# Patient Record
Sex: Male | Born: 1950 | Race: White | Hispanic: No | Marital: Married | Smoking: Former smoker
Health system: Southern US, Community
[De-identification: ages and names within clinical notes are randomized; demographics above are authoritative.]

## PROBLEM LIST (undated history)

## (undated) DIAGNOSIS — R350 Frequency of micturition: Secondary | ICD-10-CM

## (undated) DIAGNOSIS — N400 Enlarged prostate without lower urinary tract symptoms: Secondary | ICD-10-CM

## (undated) DIAGNOSIS — E291 Testicular hypofunction: Secondary | ICD-10-CM

## (undated) DIAGNOSIS — E785 Hyperlipidemia, unspecified: Secondary | ICD-10-CM

## (undated) DIAGNOSIS — I10 Essential (primary) hypertension: Secondary | ICD-10-CM

## (undated) DIAGNOSIS — Z85828 Personal history of other malignant neoplasm of skin: Secondary | ICD-10-CM

## (undated) DIAGNOSIS — G4730 Sleep apnea, unspecified: Secondary | ICD-10-CM

## (undated) DIAGNOSIS — M1990 Unspecified osteoarthritis, unspecified site: Secondary | ICD-10-CM

## (undated) DIAGNOSIS — N529 Male erectile dysfunction, unspecified: Secondary | ICD-10-CM

## (undated) DIAGNOSIS — K219 Gastro-esophageal reflux disease without esophagitis: Secondary | ICD-10-CM

## (undated) DIAGNOSIS — R011 Cardiac murmur, unspecified: Secondary | ICD-10-CM

## (undated) HISTORY — DX: Frequency of micturition: R35.0

## (undated) HISTORY — DX: Benign prostatic hyperplasia without lower urinary tract symptoms: N40.0

## (undated) HISTORY — DX: Cardiac murmur, unspecified: R01.1

## (undated) HISTORY — DX: Male erectile dysfunction, unspecified: N52.9

## (undated) HISTORY — DX: Hyperlipidemia, unspecified: E78.5

## (undated) HISTORY — PX: OTHER SURGICAL HISTORY: SHX169

## (undated) HISTORY — PX: BASAL CELL CARCINOMA EXCISION: SHX1214

## (undated) HISTORY — DX: Testicular hypofunction: E29.1

## (undated) HISTORY — DX: Essential (primary) hypertension: I10

## (undated) HISTORY — DX: Unspecified osteoarthritis, unspecified site: M19.90

## (undated) HISTORY — DX: Personal history of other malignant neoplasm of skin: Z85.828

---

## 2007-01-05 DIAGNOSIS — D239 Other benign neoplasm of skin, unspecified: Secondary | ICD-10-CM

## 2007-01-05 HISTORY — DX: Other benign neoplasm of skin, unspecified: D23.9

## 2008-07-24 DIAGNOSIS — L570 Actinic keratosis: Secondary | ICD-10-CM

## 2008-07-24 HISTORY — DX: Actinic keratosis: L57.0

## 2015-03-13 ENCOUNTER — Other Ambulatory Visit: Payer: Self-pay | Admitting: Unknown Physician Specialty

## 2015-03-23 ENCOUNTER — Encounter: Payer: Self-pay | Admitting: *Deleted

## 2015-03-24 ENCOUNTER — Ambulatory Visit: Payer: Self-pay | Admitting: Urology

## 2015-03-30 ENCOUNTER — Ambulatory Visit (INDEPENDENT_AMBULATORY_CARE_PROVIDER_SITE_OTHER): Payer: 59 | Admitting: Urology

## 2015-03-30 ENCOUNTER — Encounter: Payer: Self-pay | Admitting: Urology

## 2015-03-30 VITALS — BP 123/80 | HR 86 | Resp 18 | Ht 69.0 in | Wt 220.0 lb

## 2015-03-30 DIAGNOSIS — N138 Other obstructive and reflux uropathy: Secondary | ICD-10-CM

## 2015-03-30 DIAGNOSIS — N401 Enlarged prostate with lower urinary tract symptoms: Secondary | ICD-10-CM | POA: Diagnosis not present

## 2015-03-30 LAB — BLADDER SCAN AMB NON-IMAGING

## 2015-03-30 NOTE — Progress Notes (Signed)
03/30/2015 4:41 PM   Charle Weatherford 20-Nov-1950 [id number]  Referring provider: No referring provider defined for this encounter.  Chief Complaint  Patient presents with  . Benign Prostatic Hypertrophy  . Follow-up    Last ov 12/19/14    HPI: Today is a 64 year old white male with BPH and LUTS who is currently on Flomax, but was having urinary frequency, urgency and urge incontinence. He was found to be drinking large amounts of diet soda. It was suggested that he remove diet sodas from his diet and return in 3 months for uroflow/PVR.  He did not complete a uroflow today because he urinated prior to coming to the appointment and was found to have a low residual on today's exam. IPSS score today is 8, which is moderate symptomatology for LUTS.  His previous IPSS was 10/4 on 12/19/2014. His quality of life is 3, which is mixed due to his urinary symptoms.  His PVR today is 58 mL.  He did not completely eliminate his diet soda consumption, but he did reduce it drastically. He has seen an improvement in his urinary urgency, frequency and urge incontinence. He states he has a bad Gellis once in a week's time.  He denies any gross hematuria, dysuria or suprapubic pain. He also denies any fever, chills, nausea or vomiting.      IPSS      03/30/15 1600       International Prostate Symptom Score   How often have you had the sensation of not emptying your bladder? Less than 1 in 5     How often have you had to urinate less than every two hours? Less than 1 in 5 times     How often have you found you stopped and started again several times when you urinated? Less than 1 in 5 times     How often have you found it difficult to postpone urination? Less than 1 in 5 times     How often have you had a weak urinary stream? Less than 1 in 5 times     How often have you had to strain to start urination? Less than 1 in 5 times     How many times did you typically get up at night to urinate? 2 Times     Total IPSS Score 8     Quality of Life due to urinary symptoms   If you were to spend the rest of your life with your urinary condition just the way it is now how would you feel about that? Mixed        Score:  1-7 Mild 8-19 Moderate 20-35 Severe  PMH: Past Medical History  Diagnosis Date  . History of skin cancer   . Heart murmur   . Hypertension   . HLD (hyperlipidemia)   . Osteoarthritis   . BPH without obstruction/lower urinary tract symptoms   . Urinary frequency   . Hypogonadism in male   . Erectile dysfunction     Surgical History: Past Surgical History  Procedure Laterality Date  . Basal cell carcinoma excision      Head  . Spider bite Left arm    major reconstructive surgery for gangrene  . Circumscision      Home Medications:    Medication List       This list is accurate as of: 03/30/15  4:41 PM.  Always use your most recent med list.  amLODipine 10 MG tablet  Commonly known as:  NORVASC  Take 1 tablet by mouth  daily     aspirin EC 81 MG tablet  Take 81 mg by mouth daily.     hydrochlorothiazide 25 MG tablet  Commonly known as:  HYDRODIURIL  Take 1 tablet by mouth  daily     ketoconazole 2 % cream  Commonly known as:  NIZORAL  APPLY ON THE SKIN TWICE A [place] (AFFECTED AREA ON CHEST)     losartan 50 MG tablet  Commonly known as:  COZAAR  Take 1 tablet by mouth once a [place]     meloxicam 15 MG tablet  Commonly known as:  MOBIC  Take 15 mg by mouth daily.     ranitidine 300 MG tablet  Commonly known as:  ZANTAC     sildenafil 50 MG tablet  Commonly known as:  VIAGRA  Take 50 mg by mouth daily as needed for erectile dysfunction.     SOOLANTRA 1 % Crea  Generic drug:  Ivermectin  APPLY ON THE SKIN DAILY     tamsulosin 0.4 MG Caps capsule  Commonly known as:  FLOMAX  Take 0.4 mg by mouth daily.        Allergies: No Known Allergies  Family History: Family History  Problem Relation Age of Onset  . Hypertension  Mother     Social History:  reports that he quit smoking about 20 years ago. His smoking use included Cigarettes. He does not have any smokeless tobacco history on file. He reports that he does not drink alcohol or use illicit drugs.  ROS: Urological Symptom Review  Patient is experiencing the following symptoms: Frequent urination Hard to postpone urination Get up at night to urinate Leakage of urine Stream starts and stops Erection problems (male only)   Review of Systems  Gastrointestinal (upper)  : Indigestion/heartburn  Gastrointestinal (lower) : Negative for lower GI symptoms  Constitutional : Negative for symptoms  Skin: Negative for skin symptoms  Eyes: Negative for eye symptoms  Ear/Nose/Throat : Negative for Ear/Nose/Throat symptoms  Hematologic/Lymphatic: Negative for Hematologic/Lymphatic symptoms  Cardiovascular : Negative for cardiovascular symptoms  Respiratory : Negative for respiratory symptoms  Endocrine: Negative for endocrine symptoms  Musculoskeletal: Joint pain  Neurological: Negative for neurological symptoms  Psychologic: Negative for psychiatric symptoms  Physical Exam: BP 123/80 mmHg  Pulse 86  Resp 18  Ht 5\' 9"  (1.753 m)  Wt 220 lb (99.791 kg)  BMI 32.47 kg/m2   Laboratory Data: No results found for: WBC, HGB, HCT, MCV, PLT  No results found for: CREATININE  No results found for: PSA  No results found for: TESTOSTERONE  No results found for: HGBA1C  Urinalysis No results found for: COLORURINE, APPEARANCEUR, LABSPEC, PHURINE, GLUCOSEU, HGBUR, BILIRUBINUR, KETONESUR, PROTEINUR, UROBILINOGEN, NITRITE, LEUKOCYTESUR  Pertinent Imaging:   Assessment & Plan:   1. BPH (benign prostatic hyperplasia) with LUTS:   His IPSS today is 8/3.   His PVR is 58 mL.Patient is still not completely satisfied with his urinary symptoms.  He would like to try  a different medication. I explained him some people have had a better  response to Rapaflo, but it is a brand-name medication.  I have given him samples of Rapaflo. He is to contact the office if he finds improvement with the Rapaflo, so that we may call him in a prescription. If he does not find significant improvement with the Rapaflo, he will continue the Flomax and return in February  for IPSS, PVR, DRE and PSA.  - BLADDER SCAN AMB NON-IMAGING   No Follow-up on file.  Zara Council, [facility] [address], [place], [place] [address] [phone]

## 2015-04-09 ENCOUNTER — Encounter: Payer: Self-pay | Admitting: Podiatry

## 2015-04-09 ENCOUNTER — Ambulatory Visit (INDEPENDENT_AMBULATORY_CARE_PROVIDER_SITE_OTHER): Payer: 59 | Admitting: Podiatry

## 2015-04-09 ENCOUNTER — Ambulatory Visit (INDEPENDENT_AMBULATORY_CARE_PROVIDER_SITE_OTHER): Payer: 59

## 2015-04-09 VITALS — BP 131/76 | HR 92 | Resp 18

## 2015-04-09 DIAGNOSIS — M25572 Pain in left ankle and joints of left foot: Secondary | ICD-10-CM | POA: Diagnosis not present

## 2015-04-09 DIAGNOSIS — M7662 Achilles tendinitis, left leg: Secondary | ICD-10-CM

## 2015-04-09 DIAGNOSIS — M7732 Calcaneal spur, left foot: Secondary | ICD-10-CM

## 2015-04-09 DIAGNOSIS — Q828 Other specified congenital malformations of skin: Secondary | ICD-10-CM

## 2015-04-09 NOTE — Patient Instructions (Signed)
Plantar Fasciitis (Heel Spur Syndrome) with Rehab The plantar fascia is a fibrous, ligament-like, soft-tissue structure that spans the bottom of the foot. Plantar fasciitis is a condition that causes pain in the foot due to inflammation of the tissue. SYMPTOMS   Pain and tenderness on the underneath side of the foot.  Pain that worsens with standing or walking. CAUSES  Plantar fasciitis is caused by irritation and injury to the plantar fascia on the underneath side of the foot. Common mechanisms of injury include:  Direct trauma to bottom of the foot.  Damage to a small nerve that runs under the foot where the main fascia attaches to the heel bone.  Stress placed on the plantar fascia due to bone spurs. RISK INCREASES WITH:   Activities that place stress on the plantar fascia (running, jumping, pivoting, or cutting).  Poor strength and flexibility.  Improperly fitted shoes.  Tight calf muscles.  Flat feet.  Failure to warm-up properly before activity.  Obesity. PREVENTION  Warm up and stretch properly before activity.  Allow for adequate recovery between workouts.  Maintain physical fitness:  Strength, flexibility, and endurance.  Cardiovascular fitness.  Maintain a health body weight.  Avoid stress on the plantar fascia.  Wear properly fitted shoes, including arch supports for individuals who have flat feet. PROGNOSIS  If treated properly, then the symptoms of plantar fasciitis usually resolve without surgery. However, occasionally surgery is necessary. RELATED COMPLICATIONS   Recurrent symptoms that may result in a chronic condition.  Problems of the lower back that are caused by compensating for the injury, such as limping.  Pain or weakness of the foot during push-off following surgery.  Chronic inflammation, scarring, and partial or complete fascia tear, occurring more often from repeated injections. TREATMENT  Treatment initially involves the use of  ice and medication to help reduce pain and inflammation. The use of strengthening and stretching exercises may help reduce pain with activity, especially stretches of the Achilles tendon. These exercises may be performed at home or with a therapist. Your caregiver may recommend that you use heel cups of arch supports to help reduce stress on the plantar fascia. Occasionally, corticosteroid injections are given to reduce inflammation. If symptoms persist for greater than 6 months despite non-surgical (conservative), then surgery may be recommended.  MEDICATION   If pain medication is necessary, then nonsteroidal anti-inflammatory medications, such as aspirin and ibuprofen, or other minor pain relievers, such as acetaminophen, are often recommended.  Do not take pain medication within 7 days before surgery.  Prescription pain relievers may be given if deemed necessary by your caregiver. Use only as directed and only as much as you need.  Corticosteroid injections may be given by your caregiver. These injections should be reserved for the most serious cases, because they may only be given a certain number of times. HEAT AND COLD  Cold treatment (icing) relieves pain and reduces inflammation. Cold treatment should be applied for 10 to 15 minutes every 2 to 3 hours for inflammation and pain and immediately after any activity that aggravates your symptoms. Use ice packs or massage the area with a piece of ice (ice massage).  Heat treatment may be used prior to performing the stretching and strengthening activities prescribed by your caregiver, physical therapist, or athletic trainer. Use a heat pack or soak the injury in warm water. SEEK IMMEDIATE MEDICAL CARE IF:  Treatment seems to offer no benefit, or the condition worsens.  Any medications produce adverse side effects. EXERCISES RANGE   OF MOTION (ROM) AND STRETCHING EXERCISES - Plantar Fasciitis (Heel Spur Syndrome) These exercises may help you  when beginning to rehabilitate your injury. Your symptoms may resolve with or without further involvement from your physician, physical therapist or athletic trainer. While completing these exercises, remember:   Restoring tissue flexibility helps normal motion to return to the joints. This allows healthier, less painful movement and activity.  An effective stretch should be held for at least 30 seconds.  A stretch should never be painful. You should only feel a gentle lengthening or release in the stretched tissue. RANGE OF MOTION - Toe Extension, Flexion  Sit with your right / left leg crossed over your opposite knee.  Grasp your toes and gently pull them back toward the top of your foot. You should feel a stretch on the bottom of your toes and/or foot.  Hold this stretch for __________ seconds.  Now, gently pull your toes toward the bottom of your foot. You should feel a stretch on the top of your toes and or foot.  Hold this stretch for __________ seconds. Repeat __________ times. Complete this stretch __________ times per Taffe.  RANGE OF MOTION - Ankle Dorsiflexion, Active Assisted  Remove shoes and sit on a chair that is preferably not on a carpeted surface.  Place right / left foot under knee. Extend your opposite leg for support.  Keeping your heel down, slide your right / left foot back toward the chair until you feel a stretch at your ankle or calf. If you do not feel a stretch, slide your bottom forward to the edge of the chair, while still keeping your heel down.  Hold this stretch for __________ seconds. Repeat __________ times. Complete this stretch __________ times per Gorney.  STRETCH - Gastroc, Standing  Place hands on wall.  Extend right / left leg, keeping the front knee somewhat bent.  Slightly point your toes inward on your back foot.  Keeping your right / left heel on the floor and your knee straight, shift your weight toward the wall, not allowing your back to  arch.  You should feel a gentle stretch in the right / left calf. Hold this position for __________ seconds. Repeat __________ times. Complete this stretch __________ times per Zielinski. STRETCH - Soleus, Standing  Place hands on wall.  Extend right / left leg, keeping the other knee somewhat bent.  Slightly point your toes inward on your back foot.  Keep your right / left heel on the floor, bend your back knee, and slightly shift your weight over the back leg so that you feel a gentle stretch deep in your back calf.  Hold this position for __________ seconds. Repeat __________ times. Complete this stretch __________ times per Rohrig. STRETCH - Gastrocsoleus, Standing  Note: This exercise can place a lot of stress on your foot and ankle. Please complete this exercise only if specifically instructed by your caregiver.   Place the ball of your right / left foot on a step, keeping your other foot firmly on the same step.  Hold on to the wall or a rail for balance.  Slowly lift your other foot, allowing your body weight to press your heel down over the edge of the step.  You should feel a stretch in your right / left calf.  Hold this position for __________ seconds.  Repeat this exercise with a slight bend in your right / left knee. Repeat __________ times. Complete this stretch __________ times per Dilley.    STRENGTHENING EXERCISES - Plantar Fasciitis (Heel Spur Syndrome)  These exercises may help you when beginning to rehabilitate your injury. They may resolve your symptoms with or without further involvement from your physician, physical therapist or athletic trainer. While completing these exercises, remember:   Muscles can gain both the endurance and the strength needed for everyday activities through controlled exercises.  Complete these exercises as instructed by your physician, physical therapist or athletic trainer. Progress the resistance and repetitions only as guided. STRENGTH -  Towel Curls  Sit in a chair positioned on a non-carpeted surface.  Place your foot on a towel, keeping your heel on the floor.  Pull the towel toward your heel by only curling your toes. Keep your heel on the floor.  If instructed by your physician, physical therapist or athletic trainer, add ____________________ at the end of the towel. Repeat __________ times. Complete this exercise __________ times per Hosman. STRENGTH - Ankle Inversion  Secure one end of a rubber exercise band/tubing to a fixed object (table, pole). Loop the other end around your foot just before your toes.  Place your fists between your knees. This will focus your strengthening at your ankle.  Slowly, pull your big toe up and in, making sure the band/tubing is positioned to resist the entire motion.  Hold this position for __________ seconds.  Have your muscles resist the band/tubing as it slowly pulls your foot back to the starting position. Repeat __________ times. Complete this exercises __________ times per Manganello.  Document Released: 08/29/2005 Document Revised: 11/21/2011 Document Reviewed: 12/11/2008 [facility] Patient Information 2015 [place], [place]. This information is not intended to replace advice given to you by your health care provider. Make sure you discuss any questions you have with your health care provider. Achilles Tendinitis  with Rehab Achilles tendinitis is a disorder of the Achilles tendon. The Achilles tendon connects the large calf muscles (Gastrocnemius and Soleus) to the heel bone (calcaneus). This tendon is sometimes called the heel cord. It is important for pushing-off and standing on your toes and is important for walking, running, or jumping. Tendinitis is often caused by overuse and repetitive microtrauma. SYMPTOMS  Pain, tenderness, swelling, warmth, and redness may occur over the Achilles tendon even at rest.  Pain with pushing off, or flexing or extending the ankle.  Pain that is  worsened after or during activity. CAUSES   Overuse sometimes seen with rapid increase in exercise programs or in sports requiring running and jumping.  Poor physical conditioning (strength and flexibility or endurance).  Running sports, especially training running down hills.  Inadequate warm-up before practice or play or failure to stretch before participation.  Injury to the tendon. PREVENTION   Warm up and stretch before practice or competition.  Allow time for adequate rest and recovery between practices and competition.  Keep up conditioning.  Keep up ankle and leg flexibility.  Improve or keep muscle strength and endurance.  Improve cardiovascular fitness.  Use proper technique.  Use proper equipment (shoes, skates).  To help prevent recurrence, taping, protective strapping, or an adhesive bandage may be recommended for several weeks after healing is complete. PROGNOSIS   Recovery may take weeks to several months to heal.  Longer recovery is expected if symptoms have been prolonged.  Recovery is usually quicker if the inflammation is due to a direct blow as compared with overuse or sudden strain. RELATED COMPLICATIONS   Healing time will be prolonged if the condition is not correctly treated. The injury must  be given plenty of time to heal.  Symptoms can reoccur if activity is resumed too soon.  Untreated, tendinitis may increase the risk of tendon rupture requiring additional time for recovery and possibly surgery. TREATMENT   The first treatment consists of rest anti-inflammatory medication, and ice to relieve the pain.  Stretching and strengthening exercises after resolution of pain will likely help reduce the risk of recurrence. Referral to a physical therapist or athletic trainer for further evaluation and treatment may be helpful.  A walking boot or cast may be recommended to rest the Achilles tendon. This can help break the cycle of inflammation and  microtrauma.  Arch supports (orthotics) may be prescribed or recommended by your caregiver as an adjunct to therapy and rest.  Surgery to remove the inflamed tendon lining or degenerated tendon tissue is rarely necessary and has shown less than predictable results. MEDICATION   Nonsteroidal anti-inflammatory medications, such as aspirin and ibuprofen, may be used for pain and inflammation relief. Do not take within 7 days before surgery. Take these as directed by your caregiver. Contact your caregiver immediately if any bleeding, stomach upset, or signs of allergic reaction occur. Other minor pain relievers, such as acetaminophen, may also be used.  Pain relievers may be prescribed as necessary by your caregiver. Do not take prescription pain medication for longer than 4 to 7 days. Use only as directed and only as much as you need.  Cortisone injections are rarely indicated. Cortisone injections may weaken tendons and predispose to rupture. It is better to give the condition more time to heal than to use them. HEAT AND COLD  Cold is used to relieve pain and reduce inflammation for acute and chronic Achilles tendinitis. Cold should be applied for 10 to 15 minutes every 2 to 3 hours for inflammation and pain and immediately after any activity that aggravates your symptoms. Use ice packs or an ice massage.  Heat may be used before performing stretching and strengthening activities prescribed by your caregiver. Use a heat pack or a warm soak. SEEK MEDICAL CARE IF:  Symptoms get worse or do not improve in 2 weeks despite treatment.  New, unexplained symptoms develop. Drugs used in treatment may produce side effects. EXERCISES RANGE OF MOTION (ROM) AND STRETCHING EXERCISES - Achilles Tendinitis  These exercises may help you when beginning to rehabilitate your injury. Your symptoms may resolve with or without further involvement from your physician, physical therapist or athletic trainer. While  completing these exercises, remember:   Restoring tissue flexibility helps normal motion to return to the joints. This allows healthier, less painful movement and activity.  An effective stretch should be held for at least 30 seconds.  A stretch should never be painful. You should only feel a gentle lengthening or release in the stretched tissue. STRETCH - Gastroc, Standing   Place hands on wall.  Extend right / left leg, keeping the front knee somewhat bent.  Slightly point your toes inward on your back foot.  Keeping your right / left heel on the floor and your knee straight, shift your weight toward the wall, not allowing your back to arch.  You should feel a gentle stretch in the right / left calf. Hold this position for __________ seconds. Repeat __________ times. Complete this stretch __________ times per Galgano. STRETCH - Soleus, Standing   Place hands on wall.  Extend right / left leg, keeping the other knee somewhat bent.  Slightly point your toes inward on your back foot.  Keep your right / left heel on the floor, bend your back knee, and slightly shift your weight over the back leg so that you feel a gentle stretch deep in your back calf.  Hold this position for __________ seconds. Repeat __________ times. Complete this stretch __________ times per Parisi. STRETCH - Gastrocsoleus, Standing  Note: This exercise can place a lot of stress on your foot and ankle. Please complete this exercise only if specifically instructed by your caregiver.   Place the ball of your right / left foot on a step, keeping your other foot firmly on the same step.  Hold on to the wall or a rail for balance.  Slowly lift your other foot, allowing your body weight to press your heel down over the edge of the step.  You should feel a stretch in your right / left calf.  Hold this position for __________ seconds.  Repeat this exercise with a slight bend in your knee. Repeat __________ times.  Complete this stretch __________ times per Goetze.  STRENGTHENING EXERCISES - Achilles Tendinitis These exercises may help you when beginning to rehabilitate your injury. They may resolve your symptoms with or without further involvement from your physician, physical therapist or athletic trainer. While completing these exercises, remember:   Muscles can gain both the endurance and the strength needed for everyday activities through controlled exercises.  Complete these exercises as instructed by your physician, physical therapist or athletic trainer. Progress the resistance and repetitions only as guided.  You may experience muscle soreness or fatigue, but the pain or discomfort you are trying to eliminate should never worsen during these exercises. If this pain does worsen, stop and make certain you are following the directions exactly. If the pain is still present after adjustments, discontinue the exercise until you can discuss the trouble with your clinician. STRENGTH - Plantar-flexors   Sit with your right / left leg extended. Holding onto both ends of a rubber exercise band/tubing, loop it around the ball of your foot. Keep a slight tension in the band.  Slowly push your toes away from you, pointing them downward.  Hold this position for __________ seconds. Return slowly, controlling the tension in the band/tubing. Repeat __________ times. Complete this exercise __________ times per Kempker.  STRENGTH - Plantar-flexors   Stand with your feet shoulder width apart. Steady yourself with a wall or table using as little support as needed.  Keeping your weight evenly spread over the width of your feet, rise up on your toes.*  Hold this position for __________ seconds. Repeat __________ times. Complete this exercise __________ times per Lefeber.  *If this is too easy, shift your weight toward your right / left leg until you feel challenged. Ultimately, you may be asked to do this exercise with your  right / left foot only. STRENGTH - Plantar-flexors, Eccentric  Note: This exercise can place a lot of stress on your foot and ankle. Please complete this exercise only if specifically instructed by your caregiver.   Place the balls of your feet on a step. With your hands, use only enough support from a wall or rail to keep your balance.  Keep your knees straight and rise up on your toes.  Slowly shift your weight entirely to your right / left toes and pick up your opposite foot. Gently and with controlled movement, lower your weight through your right / left foot so that your heel drops below the level of the step. You will feel a  slight stretch in the back of your calf at the end position.  Use the healthy leg to help rise up onto the balls of both feet, then lower weight only on the right / left leg again. Build up to 15 repetitions. Then progress to 3 consecutive sets of 15 repetitions.*  After completing the above exercise, complete the same exercise with a slight knee bend (about 30 degrees). Again, build up to 15 repetitions. Then progress to 3 consecutive sets of 15 repetitions.* Perform this exercise __________ times per Matos.  *When you easily complete 3 sets of 15, your physician, physical therapist or athletic trainer may advise you to add resistance by wearing a backpack filled with additional weight. STRENGTH - Plantar Flexors, Seated   Sit on a chair that allows your feet to rest flat on the ground. If necessary, sit at the edge of the chair.  Keeping your toes firmly on the ground, lift your right / left heel as far as you can without increasing any discomfort in your ankle. Repeat __________ times. Complete this exercise __________ times a Pestka. *If instructed by your physician, physical therapist or athletic trainer, you may add ____________________ of resistance by placing a weighted object on your right / left knee. Document Released: 03/30/2005 Document Revised: 11/21/2011  Document Reviewed: 12/11/2008 [facility] Patient Information 2015 [place], [place]. This information is not intended to replace advice given to you by your health care provider. Make sure you discuss any questions you have with your health care provider.

## 2015-04-09 NOTE — Progress Notes (Signed)
   Subjective:    Patient ID: Richard Dean, male    DOB: [birth year], 64 y.o.   MRN: [id number]  HPI  64 year old male presents the office they with concerns of pain to the back of his left ankle on the course the Achilles tendon his been ongoing for approximately 3-4 months. He doesn't that he purchase new shoes recently which seem to help alleviate his symptoms although he does continue to get some mild discomfort in the back of the area. He has pain when he first gets up in the morning and as he walks, eases up. Denies any swelling or redness. Denies numbness or tingling. No history of injury or trauma. He has says is a painful callus in the right foot. Most of his pain is areas of pressure and weightbearing. No other complaints at this time.   Review of Systems  Musculoskeletal:       Joint pain  All other systems reviewed and are negative.      Objective:   Physical Exam  AAO x3, NAD DP/PT pulses palpable bilaterally, CRT less than 3 seconds Protective sensation intact with Simms Weinstein monofilament, vibratory sensation intact, Achilles tendon reflex intact  there is mild discomfort along the posterior aspect of the left calcaneus and along the insertion of the Achilles tendon. Heel spurs palpable. There is no overlying edema, erythema, increase in warmth. There is no tenderness on the mid substance of the Achilles tendon no defect is noted. Thompson test is negative. There is no discomfort on the plantar aspect of the calcaneus on the course last insertion of the plantar fascia. No pain with lateral compression of the calcaneus. No other areas of tenderness to bilateral lower extremities. MMT 5/5, ROM WNL.   right foot submetatarsal 2 hyperkeratotic lesion upon debridement no on Ulceration, drainage or other signs of infection. No open lesions or pre-ulcerative lesions.  No overlying edema, erythema, increase in warmth to bilateral lower extremities.  No pain with calf compression,  swelling, warmth, erythema bilaterally.      Assessment & Plan:   64 year old male left Achilles tendinitis , right submetatarsal 2  Porokeratosis -X-rays were obtained and reviewed with the patient.  -Treatment options discussed including all alternatives, risks, and complications -Discussed likely etiology of his symptoms. -For the left Achilles tendinitis/retrocalcaneal exostosis discussed stretching exercises to perform a daily basis. Ice to the area. Prescribed mobic. Discussed side effects of the medication and directed to stop if any are to occur and call the office. Continue shoegear changes. Discussed orthotics -Right submetatarsal 2 hyperkeratotic lesion sharply debrided without complications by splitting. -Follow-up 4 weeks or sooner if any problems arise. In the meantime, encouraged to call the office with any questions, concerns, change in symptoms.   Celesta Gentile, DPM

## 2015-04-10 ENCOUNTER — Encounter: Payer: Self-pay | Admitting: Podiatry

## 2015-05-01 ENCOUNTER — Other Ambulatory Visit: Payer: Self-pay | Admitting: Unknown Physician Specialty

## 2015-05-05 ENCOUNTER — Ambulatory Visit (INDEPENDENT_AMBULATORY_CARE_PROVIDER_SITE_OTHER): Payer: 59 | Admitting: Unknown Physician Specialty

## 2015-05-05 ENCOUNTER — Encounter: Payer: Self-pay | Admitting: Unknown Physician Specialty

## 2015-05-05 VITALS — BP 143/90 | HR 86 | Temp 98.2°F | Ht 67.7 in | Wt 222.0 lb

## 2015-05-05 DIAGNOSIS — R059 Cough, unspecified: Secondary | ICD-10-CM

## 2015-05-05 DIAGNOSIS — E785 Hyperlipidemia, unspecified: Secondary | ICD-10-CM

## 2015-05-05 DIAGNOSIS — I10 Essential (primary) hypertension: Secondary | ICD-10-CM | POA: Diagnosis not present

## 2015-05-05 DIAGNOSIS — R05 Cough: Secondary | ICD-10-CM | POA: Diagnosis not present

## 2015-05-05 LAB — LIPID PANEL PICCOLO, WAIVED
Chol/HDL Ratio Piccolo,Waive: 4.1 mg/dL
Cholesterol Piccolo, Waived: 155 mg/dL (ref <200)
HDL Chol Piccolo, Waived: 38 mg/dL — ABNORMAL LOW (ref >59)
LDL Chol Calc Piccolo Waived: 70 mg/dL (ref <100)
Triglycerides Piccolo,Waived: 236 mg/dL — ABNORMAL HIGH (ref <150)
VLDL Chol Calc Piccolo,Waive: 47 mg/dL — ABNORMAL HIGH (ref <30)

## 2015-05-05 MED ORDER — MELOXICAM 15 MG PO TABS: 15.0000 mg | ORAL_TABLET | Freq: Every day | ORAL | Status: DC

## 2015-05-05 MED ORDER — SILDENAFIL CITRATE 50 MG PO TABS: 50.0000 mg | ORAL_TABLET | Freq: Every day | ORAL | Status: DC | PRN

## 2015-05-05 MED ORDER — AMLODIPINE BESYLATE 10 MG PO TABS: 10.0000 mg | ORAL_TABLET | Freq: Every day | ORAL | Status: DC

## 2015-05-05 MED ORDER — LOSARTAN POTASSIUM 50 MG PO TABS: 50.0000 mg | ORAL_TABLET | Freq: Every day | ORAL | Status: DC

## 2015-05-05 MED ORDER — TAMSULOSIN HCL 0.4 MG PO CAPS: 0.4000 mg | ORAL_CAPSULE | Freq: Every day | ORAL | Status: DC

## 2015-05-05 MED ORDER — RANITIDINE HCL 300 MG PO TABS: 300.0000 mg | ORAL_TABLET | Freq: Every day | ORAL | Status: DC

## 2015-05-05 MED ORDER — HYDROCHLOROTHIAZIDE 25 MG PO TABS: 25.0000 mg | ORAL_TABLET | Freq: Every day | ORAL | Status: DC

## 2015-05-05 NOTE — Assessment & Plan Note (Addendum)
Blood Pressure elevated today. Will attempt to decrease use of Meloxicam. Will recheck in 6 months.

## 2015-05-05 NOTE — Assessment & Plan Note (Addendum)
Reviewed cholesterol labs. Non-fasting lab draw. Will recheck in 6 months.

## 2015-05-05 NOTE — Progress Notes (Signed)
BP 143/90 mmHg  Pulse 86  Temp(Src) 98.2 F (36.8 C)  Ht 5' 7.7" (1.72 m)  Wt 222 lb (100.699 kg)  BMI 34.04 kg/m2  SpO2 97%   Subjective:    Patient ID: Richard Dean, male    DOB: [birth year], 64 y.o.   MRN: [id number]  HPI: [name] is a 64 y.o. male  Chief Complaint  Patient presents with  . Medication Refill  . Cough    pt states he has been coughing for about 3 to 4 weeks, also has some congestion   Hypertension/Hyperlipidemia: Denies headache, chest pain or shortness of breath. Does not monitor blood pressure at home but states he has been to several doctor visits recently and his blood pressure has always been good. Takes medications as directed.    Cough: Complaining of a dry cough and congestions that started about 3 weeks ago. Has not tried anything for the cough. Denies sinus pressure or headache. Denies shortness of breath.  It was suggested to him that possibly Losartan is causing the cough.    Relevant past medical, surgical, family and social history reviewed and updated as indicated. Interim medical history since our last visit reviewed. Allergies and medications reviewed and updated.  Review of Systems  Constitutional: Negative.  Negative for activity change, appetite change and fatigue.  HENT: Positive for congestion.   Eyes: Negative.   Respiratory: Positive for cough. Negative for chest tightness, shortness of breath, wheezing and stridor.   Cardiovascular: Negative.  Negative for chest pain, palpitations and leg swelling.  Skin: Negative.  Negative for color change, pallor, rash and wound.  Psychiatric/Behavioral: Negative.  Negative for confusion and agitation. The patient is not nervous/anxious.     Per HPI unless specifically indicated above     Objective:    BP 143/90 mmHg  Pulse 86  Temp(Src) 98.2 F (36.8 C)  Ht 5' 7.7" (1.72 m)  Wt 222 lb (100.699 kg)  BMI 34.04 kg/m2  SpO2 97%  Wt Readings from Last 3 Encounters:  05/05/15 222 lb  (100.699 kg)  03/30/15 220 lb (99.791 kg)    Physical Exam  Constitutional: He is oriented to person, place, and time. He appears well-developed and well-nourished. No distress.  HENT:  Head: Normocephalic and atraumatic.  Neck: Normal range of motion.  Cardiovascular: Normal rate, regular rhythm and normal heart sounds.  Exam reveals no gallop and no friction rub.   No murmur heard. Pulmonary/Chest: Effort normal and breath sounds normal. No respiratory distress. He has no wheezes. He has no rales. He exhibits no tenderness.  Neurological: He is alert and oriented to person, place, and time.  Skin: Skin is warm and dry. No rash noted. He is not diaphoretic. No erythema. No pallor.  Psychiatric: He has a normal mood and affect. His behavior is normal. Judgment and thought content normal.        Assessment & Plan:   Problem List Items Addressed This Visit      Unprioritized   Essential hypertension - Primary    Blood Pressure elevated today. Will attempt to decrease use of Meloxicam. Will recheck in 6 months.      Relevant Medications   amLODipine (NORVASC) 10 MG tablet   hydrochlorothiazide (HYDRODIURIL) 25 MG tablet   losartan (COZAAR) 50 MG tablet   sildenafil (VIAGRA) 50 MG tablet   Other Relevant Orders   Comprehensive metabolic panel   Hyperlipidemia    Non-fasting lab draw. Will recheck in 6 months.  Relevant Medications   amLODipine (NORVASC) 10 MG tablet   hydrochlorothiazide (HYDRODIURIL) 25 MG tablet   losartan (COZAAR) 50 MG tablet   sildenafil (VIAGRA) 50 MG tablet   Other Relevant Orders   Lipid Panel Piccolo, Waived    Other Visit Diagnoses    Cough        Stop Losartan for one month. If no improvement then start over the counter Claritin        Follow up plan: Follow up in 6 months (about 11/05/15) for physical.

## 2015-05-06 ENCOUNTER — Encounter: Payer: Self-pay | Admitting: Family Medicine

## 2015-05-06 LAB — COMPREHENSIVE METABOLIC PANEL
ALT: 21 IU/L (ref 0–44)
AST: 21 IU/L (ref 0–40)
Albumin/Globulin Ratio: 2 (ref 1.1–2.5)
Albumin: 4.4 g/dL (ref 3.6–4.8)
Alkaline Phosphatase: 66 IU/L (ref 39–117)
BUN/Creatinine Ratio: 15 (ref 10–22)
BUN: 15 mg/dL (ref 8–27)
Bilirubin Total: 0.7 mg/dL (ref 0.0–1.2)
CO2: 27 mmol/L (ref 18–29)
Calcium: 9 mg/dL (ref 8.6–10.2)
Chloride: 99 mmol/L (ref 97–108)
Creatinine, Ser: 1.02 mg/dL (ref 0.76–1.27)
GFR calc Af Amer: 89 mL/min/{1.73_m2} (ref >59)
GFR calc non Af Amer: 77 mL/min/{1.73_m2} (ref >59)
Globulin, Total: 2.2 g/dL (ref 1.5–4.5)
Glucose: 82 mg/dL (ref 65–99)
Potassium: 4.3 mmol/L (ref 3.5–5.2)
Sodium: 140 mmol/L (ref 134–144)
Total Protein: 6.6 g/dL (ref 6.0–8.5)

## 2015-05-07 ENCOUNTER — Ambulatory Visit (INDEPENDENT_AMBULATORY_CARE_PROVIDER_SITE_OTHER): Payer: 59 | Admitting: Podiatry

## 2015-05-07 ENCOUNTER — Encounter: Payer: Self-pay | Admitting: Podiatry

## 2015-05-07 DIAGNOSIS — M7662 Achilles tendinitis, left leg: Secondary | ICD-10-CM

## 2015-05-07 MED ORDER — METHYLPREDNISOLONE 4 MG PO TBPK: ORAL_TABLET | ORAL | Status: DC

## 2015-05-07 NOTE — Progress Notes (Signed)
Patient ID: Richard Dean, male   DOB: Mar 12, 1951, 64 y.o.   MRN: [id number]  Subjective: 64 year old male presents the office they for follow-up evaluation of left heel pain, Achilles tendinitis. He states that overall he is doing better but he definitely get some discomfort with activity or standing.She does continue stretching, night splint, icing, anti-inflammatories. He denies any recent injury or trauma. No swelling or redness. There is no tendon or numbness. No other complaints at this time in no acute changes since last appointment.  Objective: AAO 3, NAD DP/PT pulses palpable, CRT less than 3 seconds Protective sensation intact with Simms Weinstein monofilament There is mild discomfort on the posterior aspect of the left Achilles tendon along the mid substance of the tendon. There is no pain along the insertion of the calcaneus. There is no defect noted and Thompson test is negative. There is no other areas of tenderness to bilateral lower extremities. No overlying edema, erythema, increase in warmth. No open lesions or pre-ulcerative lesions. There is no pain with calf compression, swelling, warmth, erythema.  Assessment: 64 year old male with continuation left Achilles tendinitis  Plan: -Treatment options discussed including all alternatives, risks, and complications -Discussed with him Medrol Dosepak for which she would like to try. I discussed the risks and side effects and for which she understands. I prescribed this today. Hold off on anti-inflammatories while taking steroid. He understood. -Continue stretching, icing, night splint. -Continue to wear supportive shoe at all times. -Follow-up in 4 weeks if symptoms continue or sooner if any problems arise. In the meantime, encouraged to call the office with any questions, concerns, change in symptoms.   Annice Needy, DPM

## 2015-06-04 ENCOUNTER — Ambulatory Visit: Payer: 59 | Admitting: Podiatry

## 2015-09-04 ENCOUNTER — Other Ambulatory Visit: Payer: Self-pay | Admitting: Unknown Physician Specialty

## 2015-10-16 ENCOUNTER — Ambulatory Visit (INDEPENDENT_AMBULATORY_CARE_PROVIDER_SITE_OTHER): Payer: 59

## 2015-10-16 DIAGNOSIS — Z23 Encounter for immunization: Secondary | ICD-10-CM

## 2015-10-26 ENCOUNTER — Other Ambulatory Visit: Payer: Self-pay | Admitting: Unknown Physician Specialty

## 2015-10-28 ENCOUNTER — Other Ambulatory Visit: Payer: Self-pay | Admitting: Unknown Physician Specialty

## 2015-11-02 ENCOUNTER — Ambulatory Visit: Payer: 59 | Admitting: Urology

## 2015-11-02 ENCOUNTER — Encounter: Payer: Self-pay | Admitting: Urology

## 2015-11-10 ENCOUNTER — Encounter: Payer: Self-pay | Admitting: Unknown Physician Specialty

## 2015-11-10 ENCOUNTER — Ambulatory Visit (INDEPENDENT_AMBULATORY_CARE_PROVIDER_SITE_OTHER): Payer: 59 | Admitting: Unknown Physician Specialty

## 2015-11-10 VITALS — BP 143/84 | HR 79 | Temp 98.5°F | Ht 66.3 in | Wt 218.8 lb

## 2015-11-10 DIAGNOSIS — Z23 Encounter for immunization: Secondary | ICD-10-CM | POA: Diagnosis not present

## 2015-11-10 DIAGNOSIS — M150 Primary generalized (osteo)arthritis: Secondary | ICD-10-CM

## 2015-11-10 DIAGNOSIS — M159 Polyosteoarthritis, unspecified: Secondary | ICD-10-CM

## 2015-11-10 DIAGNOSIS — I10 Essential (primary) hypertension: Secondary | ICD-10-CM

## 2015-11-10 DIAGNOSIS — M1990 Unspecified osteoarthritis, unspecified site: Secondary | ICD-10-CM | POA: Insufficient documentation

## 2015-11-10 DIAGNOSIS — Z0000 Encounter for general adult medical examination without abnormal findings: Secondary | ICD-10-CM

## 2015-11-10 DIAGNOSIS — M8949 Other hypertrophic osteoarthropathy, multiple sites: Secondary | ICD-10-CM

## 2015-11-10 MED ORDER — LOSARTAN POTASSIUM 50 MG PO TABS: 50.0000 mg | ORAL_TABLET | Freq: Every day | ORAL | Status: DC

## 2015-11-10 MED ORDER — HYDROCHLOROTHIAZIDE 25 MG PO TABS: 25.0000 mg | ORAL_TABLET | Freq: Every day | ORAL | Status: DC

## 2015-11-10 MED ORDER — MELOXICAM 15 MG PO TABS: 15.0000 mg | ORAL_TABLET | Freq: Every day | ORAL | Status: DC

## 2015-11-10 MED ORDER — RANITIDINE HCL 300 MG PO TABS: 300.0000 mg | ORAL_TABLET | Freq: Every day | ORAL | Status: DC

## 2015-11-10 MED ORDER — AMLODIPINE BESYLATE 10 MG PO TABS: 10.0000 mg | ORAL_TABLET | Freq: Every day | ORAL | Status: DC

## 2015-11-10 NOTE — Progress Notes (Signed)
-  BP 143/84 mmHg  Pulse 79  Temp(Src) 98.5 F (36.9 C)  Ht 5' 6.3" (1.684 m)  Wt 218 lb 12.8 oz (99.247 kg)  BMI 35.00 kg/m2  SpO2 97%   Subjective:    Patient ID: Richard Dean, male    DOB: 1950/11/09, 65 y.o.   MRN: [id number]  HPI: Richard Dean is a 65 y.o. male  Chief Complaint  Patient presents with  . Annual Exam   Hypertension Using medications without difficulty Average home BPs not checking   No problems or lightheadedness No chest pain with exertion or shortness of breath No Edema  Joint pain Pt taking Meloxicam for hand and knee pain.  He has tried to go off in the past but felt like he needed to go back on.  Taking Zantac for stomach protection  ED Taking Viagra prn  Past Medical History  Diagnosis Date  . History of skin cancer   . Heart murmur   . Hypertension   . HLD (hyperlipidemia)   . Osteoarthritis   . BPH without obstruction/lower urinary tract symptoms   . Urinary frequency   . Hypogonadism in male   . Erectile dysfunction    Past Surgical History  Procedure Laterality Date  . Basal cell carcinoma excision      Head  . Spider bite Left arm    major reconstructive surgery for gangrene  . Circumscision     Family History  Problem Relation Age of Onset  . Hypertension Mother   . Cancer Father     lung  . Heart disease Father     MI  . Hypertension Brother   . Allergies Daughter   . Hypertension Son   . Thyroid disease Son   . Hypertension Maternal Grandmother   . Hypertension Maternal Grandfather   . Hypertension Brother     Relevant past medical, surgical, family and social history reviewed and updated as indicated. Interim medical history since our last visit reviewed. Allergies and medications reviewed and updated.  Review of Systems  Per HPI unless specifically indicated above     Objective:    BP 143/84 mmHg  Pulse 79  Temp(Src) 98.5 F (36.9 C)  Ht 5' 6.3" (1.684 m)  Wt 218 lb 12.8 oz (99.247 kg)  BMI 35.00  kg/m2  SpO2 97%  Wt Readings from Last 3 Encounters:  11/10/15 218 lb 12.8 oz (99.247 kg)  05/05/15 222 lb (100.699 kg)  03/30/15 220 lb (99.791 kg)    Physical Exam  Constitutional: He is oriented to person, place, and time. He appears well-developed and well-nourished.  HENT:  Head: Normocephalic.  Right Ear: Tympanic membrane, external ear and ear canal normal.  Left Ear: Tympanic membrane, external ear and ear canal normal.  Mouth/Throat: Uvula is midline, oropharynx is clear and moist and mucous membranes are normal.  Eyes: Pupils are equal, round, and reactive to light.  Cardiovascular: Normal rate, regular rhythm and normal heart sounds.  Exam reveals no gallop and no friction rub.   No murmur heard. Pulmonary/Chest: Effort normal and breath sounds normal. No respiratory distress.  Abdominal: Soft. Bowel sounds are normal. He exhibits no distension. There is no tenderness.  Musculoskeletal: Normal range of motion.  Neurological: He is alert and oriented to person, place, and time. He has normal reflexes.  Skin: Skin is warm and dry.  Psychiatric: He has a normal mood and affect. His behavior is normal. Judgment and thought content normal.      Assessment &  Plan:   Problem List Items Addressed This Visit      Unprioritized   Essential hypertension    Stable, continue present medications.        Relevant Medications   hydrochlorothiazide (HYDRODIURIL) 25 MG tablet   losartan (COZAAR) 50 MG tablet   amLODipine (NORVASC) 10 MG tablet   Other Relevant Orders   Comprehensive metabolic panel   Lipid Panel w/o Chol/HDL Ratio   Osteoarthritis    Unable to stop Meloxicam.  Continue with close monitoring of kidney function      Relevant Medications   meloxicam (MOBIC) 15 MG tablet    Other Visit Diagnoses    Need for pneumococcal vaccination    -  Primary    Relevant Orders    Pneumococcal conjugate vaccine 13-valent IM (Completed)    Annual physical exam         Relevant Orders    Hepatitis C antibody    HIV antibody    CBC with Differential/Platelet    PSA    TSH        Follow up plan: Return in about 6 months (around 05/09/2016).

## 2015-11-10 NOTE — Assessment & Plan Note (Signed)
Stable, continue present medications.   

## 2015-11-10 NOTE — Assessment & Plan Note (Signed)
Unable to stop Meloxicam.  Continue with close monitoring of kidney function

## 2015-11-11 ENCOUNTER — Other Ambulatory Visit: Payer: Self-pay | Admitting: Unknown Physician Specialty

## 2015-11-11 ENCOUNTER — Ambulatory Visit: Payer: 59 | Admitting: Urology

## 2015-11-11 DIAGNOSIS — D509 Iron deficiency anemia, unspecified: Secondary | ICD-10-CM

## 2015-11-11 LAB — COMPREHENSIVE METABOLIC PANEL
ALT: 19 IU/L (ref 0–44)
AST: 20 IU/L (ref 0–40)
Albumin/Globulin Ratio: 2.1 (ref 1.1–2.5)
Albumin: 4.5 g/dL (ref 3.6–4.8)
Alkaline Phosphatase: 66 IU/L (ref 39–117)
BUN/Creatinine Ratio: 13 (ref 10–22)
BUN: 13 mg/dL (ref 8–27)
Bilirubin Total: 1 mg/dL (ref 0.0–1.2)
CO2: 25 mmol/L (ref 18–29)
Calcium: 9.2 mg/dL (ref 8.6–10.2)
Chloride: 99 mmol/L (ref 96–106)
Creatinine, Ser: 1.04 mg/dL (ref 0.76–1.27)
GFR calc Af Amer: 87 mL/min/{1.73_m2} (ref >59)
GFR calc non Af Amer: 75 mL/min/{1.73_m2} (ref >59)
Globulin, Total: 2.1 g/dL (ref 1.5–4.5)
Glucose: 80 mg/dL (ref 65–99)
Potassium: 4.2 mmol/L (ref 3.5–5.2)
Sodium: 141 mmol/L (ref 134–144)
Total Protein: 6.6 g/dL (ref 6.0–8.5)

## 2015-11-11 LAB — CBC WITH DIFFERENTIAL/PLATELET
Basophils Absolute: 0 10*3/uL (ref 0.0–0.2)
Basos: 0 %
EOS (ABSOLUTE): 0.3 10*3/uL (ref 0.0–0.4)
Eos: 5 %
Hematocrit: 37.4 % — ABNORMAL LOW (ref 37.5–51.0)
Hemoglobin: 12.4 g/dL — ABNORMAL LOW (ref 12.6–17.7)
Immature Grans (Abs): 0 10*3/uL (ref 0.0–0.1)
Immature Granulocytes: 0 %
Lymphocytes Absolute: 2.6 10*3/uL (ref 0.7–3.1)
Lymphs: 42 %
MCH: 25.9 pg — ABNORMAL LOW (ref 26.6–33.0)
MCHC: 33.2 g/dL (ref 31.5–35.7)
MCV: 78 fL — ABNORMAL LOW (ref 79–97)
Monocytes Absolute: 0.3 10*3/uL (ref 0.1–0.9)
Monocytes: 4 %
Neutrophils Absolute: 3 10*3/uL (ref 1.4–7.0)
Neutrophils: 49 %
Platelets: 170 10*3/uL (ref 150–379)
RBC: 4.78 x10E6/uL (ref 4.14–5.80)
RDW: 16.3 % — ABNORMAL HIGH (ref 12.3–15.4)
WBC: 6.2 10*3/uL (ref 3.4–10.8)

## 2015-11-11 LAB — LIPID PANEL W/O CHOL/HDL RATIO
Cholesterol, Total: 157 mg/dL (ref 100–199)
HDL: 35 mg/dL — ABNORMAL LOW (ref >39)
LDL Calculated: 88 mg/dL (ref 0–99)
Triglycerides: 168 mg/dL — ABNORMAL HIGH (ref 0–149)
VLDL Cholesterol Cal: 34 mg/dL (ref 5–40)

## 2015-11-11 LAB — PSA: Prostate Specific Ag, Serum: 1.1 ng/mL (ref 0.0–4.0)

## 2015-11-11 LAB — HEPATITIS C ANTIBODY: Hep C Virus Ab: <0.1 s/co ratio (ref 0.0–0.9)

## 2015-11-11 LAB — HIV ANTIBODY (ROUTINE TESTING W REFLEX): HIV Screen 4th Generation wRfx: NONREACTIVE

## 2015-11-11 LAB — TSH: TSH: 4.78 u[IU]/mL — ABNORMAL HIGH (ref 0.450–4.500)

## 2015-11-24 ENCOUNTER — Telehealth: Payer: Self-pay | Admitting: Family Medicine

## 2015-11-24 ENCOUNTER — Other Ambulatory Visit: Payer: Self-pay

## 2015-11-24 DIAGNOSIS — Z1211 Encounter for screening for malignant neoplasm of colon: Secondary | ICD-10-CM

## 2015-11-24 DIAGNOSIS — R195 Other fecal abnormalities: Secondary | ICD-10-CM

## 2015-11-24 DIAGNOSIS — D509 Iron deficiency anemia, unspecified: Secondary | ICD-10-CM

## 2015-11-24 LAB — FECAL OCCULT BLOOD, GUAIAC
Specimen 1: NEGATIVE
Specimen 2: POSITIVE
Specimen 3: POSITIVE

## 2015-11-24 NOTE — Telephone Encounter (Signed)
Patient notified

## 2015-11-24 NOTE — Telephone Encounter (Signed)
Please let him know that one of his stool tests was positive for blood, so we do recommend that he have a colonoscopy. Referral made today.

## 2015-11-25 ENCOUNTER — Encounter: Payer: Self-pay | Admitting: Urology

## 2015-11-25 ENCOUNTER — Ambulatory Visit (INDEPENDENT_AMBULATORY_CARE_PROVIDER_SITE_OTHER): Payer: 59 | Admitting: Urology

## 2015-11-25 VITALS — BP 162/97 | HR 73 | Ht 69.0 in | Wt 223.3 lb

## 2015-11-25 DIAGNOSIS — N401 Enlarged prostate with lower urinary tract symptoms: Secondary | ICD-10-CM | POA: Diagnosis not present

## 2015-11-25 DIAGNOSIS — N528 Other male erectile dysfunction: Secondary | ICD-10-CM | POA: Diagnosis not present

## 2015-11-25 DIAGNOSIS — N138 Other obstructive and reflux uropathy: Secondary | ICD-10-CM

## 2015-11-25 DIAGNOSIS — N529 Male erectile dysfunction, unspecified: Secondary | ICD-10-CM

## 2015-11-25 LAB — BLADDER SCAN AMB NON-IMAGING: Scan Result: 50

## 2015-11-25 NOTE — Progress Notes (Signed)
8:54 AM   Richard Dean 1950/12/21 [id number]  Referring provider: Kathrine Haddock, NP [place], [place] [address]  Chief Complaint  Patient presents with  . Follow-up    HPI: Patient is a 65 year old Caucasian male with erectile dysfunction and BPH and LUTS who presents today for a 6 month follow-up.  Previous history Patient with BPH and LUTS who was on Flomax, but was having urinary frequency, urgency and urge incontinence. He was found to be drinking large amounts of diet soda. It was suggested that he remove diet sodas from his diet and return in 3 months for uroflow/PVR.  He did not complete a uroflow because he urinated prior to coming to the appointment and was found to have a low residual on today's exam.   He did not completely eliminate his diet soda consumption, but he did reduce it drastically. He has seen an improvement in his urinary urgency, frequency and urge incontinence. He states he has a bad Cavallero once in a week's time.  He was changed to Rapaflo, but he did not find significant improvement and it was cost prohibitive.  Erectile dysfunction He has been having difficulty with erections for the last few years.   His major complaint is maintaining an erection.  His libido is preserved.   His risk factors for ED are HTN, age, hyperlipidemia and increased BMI.  He denies any painful erections or curvatures with his erections.   He has tried Viagra in the past with good results.  BPH WITH LUTS His IPSS score today is 13, which is moderate lower urinary tract symptomatology. He is mostly dissatisfied with his quality life due to his urinary symptoms.  His PVR is 58 mL.  His major complaint today urinary urgency.  He has had these symptoms for two years.  He denies any dysuria, hematuria or suprapubic pain.   He also denies any recent fevers, chills, nausea or vomiting.  He does not have a family history of PCa.      IPSS      11/25/15 1500       International Prostate  Symptom Score   How often have you had the sensation of not emptying your bladder? Less than half the time     How often have you had to urinate less than every two hours? Less than half the time     How often have you found you stopped and started again several times when you urinated? Less than 1 in 5 times     How often have you found it difficult to postpone urination? More than half the time     How often have you had a weak urinary stream? Less than 1 in 5 times     How often have you had to strain to start urination? Less than 1 in 5 times     How many times did you typically get up at night to urinate? 2 Times     Total IPSS Score 13     Quality of Life due to urinary symptoms   If you were to spend the rest of your life with your urinary condition just the way it is now how would you feel about that? Mostly Disatisfied        Score:  1-7 Mild 8-19 Moderate 20-35 Severe       PMH: Past Medical History  Diagnosis Date  . History of skin cancer   . Heart murmur   . Hypertension   .  HLD (hyperlipidemia)   . Osteoarthritis   . BPH without obstruction/lower urinary tract symptoms   . Urinary frequency   . Hypogonadism in male   . Erectile dysfunction     Surgical History: Past Surgical History  Procedure Laterality Date  . Basal cell carcinoma excision      Head  . Spider bite Left arm    major reconstructive surgery for gangrene  . Circumscision      Home Medications:    Medication List       This list is accurate as of: 11/25/15 11:59 PM.  Always use your most recent med list.               amLODipine 10 MG tablet  Commonly known as:  NORVASC  Take 1 tablet (10 mg total) by mouth daily.     aspirin EC 81 MG tablet  Take 81 mg by mouth daily.     hydrochlorothiazide 25 MG tablet  Commonly known as:  HYDRODIURIL  Take 1 tablet (25 mg total) by mouth daily.     ketoconazole 2 % cream  Commonly known as:  NIZORAL  APPLY ON THE SKIN TWICE A [place]  (AFFECTED AREA ON CHEST)     losartan 50 MG tablet  Commonly known as:  COZAAR  Take 1 tablet (50 mg total) by mouth daily.     meloxicam 15 MG tablet  Commonly known as:  MOBIC  Take 1 tablet (15 mg total) by mouth daily.     ranitidine 300 MG tablet  Commonly known as:  ZANTAC  Take 1 tablet (300 mg total) by mouth at bedtime.     sildenafil 50 MG tablet  Commonly known as:  VIAGRA  Take 1 tablet (50 mg total) by mouth daily as needed for erectile dysfunction.        Allergies: No Known Allergies  Family History: Family History  Problem Relation Age of Onset  . Hypertension Mother   . Cancer Father     lung  . Heart disease Father     MI  . Hypertension Brother   . Allergies Daughter   . Hypertension Son   . Thyroid disease Son   . Hypertension Maternal Grandmother   . Hypertension Maternal Grandfather   . Hypertension Brother     Social History:  reports that he quit smoking about 20 years ago. His smoking use included Cigarettes. He has never used smokeless tobacco. He reports that he does not drink alcohol or use illicit drugs.  ROS: Urological Symptom Review  Patient is experiencing the following symptoms: Frequent urination Hard to postpone urination Get up at night to urinate Leakage of urine Stream starts and stops Erection problems (male only)   Review of Systems  Gastrointestinal (upper)  : Indigestion/heartburn  Gastrointestinal (lower) : Negative for lower GI symptoms  Constitutional : Negative for symptoms  Skin: Negative for skin symptoms  Eyes: Negative for eye symptoms  Ear/Nose/Throat : Negative for Ear/Nose/Throat symptoms  Hematologic/Lymphatic: Negative for Hematologic/Lymphatic symptoms  Cardiovascular : Negative for cardiovascular symptoms  Respiratory : Negative for respiratory symptoms  Endocrine: Negative for endocrine symptoms  Musculoskeletal: Joint pain  Neurological: Negative for neurological  symptoms  Psychologic: Negative for psychiatric symptoms  Physical Exam: BP 162/97 mmHg  Pulse 73  Ht 5\' 9"  (1.753 m)  Wt 223 lb 4.8 oz (101.288 kg)  BMI 32.96 kg/m2  Constitutional: Well nourished. Alert and oriented, No acute distress. HEENT: [place] AT, moist mucus membranes. Trachea  midline, no masses. Cardiovascular: No clubbing, cyanosis, or edema. Respiratory: Normal respiratory effort, no increased work of breathing. GI: Abdomen is soft, non tender, non distended, no abdominal masses. Liver and spleen not palpable.  No hernias appreciated.  Stool sample for occult testing is not indicated.   GU: No CVA tenderness.  No bladder fullness or masses.  Patient with circumcised phallus.   Urethral meatus is patent.  No penile discharge. No penile lesions or rashes. Scrotum without lesions, cysts, rashes and/or edema.  Testicles are located scrotally bilaterally. No masses are appreciated in the testicles. Left and right epididymis are normal. Rectal: Patient with  normal sphincter tone. Anus and perineum without scarring or rashes. No rectal masses are appreciated. Prostate is approximately 55 grams, no nodules are appreciated. Seminal vesicles are normal. Skin: No rashes, bruises or suspicious lesions. Lymph: No cervical or inguinal adenopathy. Neurologic: Grossly intact, no focal deficits, moving all 4 extremities. Psychiatric: Normal mood and affect.  Laboratory Data: Lab Results  Component Value Date   WBC 6.2 11/10/2015   HCT 37.4* 11/10/2015   MCV 78* 11/10/2015   PLT 170 11/10/2015   Lab Results  Component Value Date   CREATININE 1.04 11/10/2015   PSA History  1.1 ng/mL on 11/10/2015   Assessment & Plan:   1. Erectile dysfunction:   Patient is satisfied with the Viagra. He is not in need of a refill at this time.   We will obtain an SHIM questionnaire and exam in 1 year.   2. BPH (benign prostatic hyperplasia) with LUTS:   His IPSS today is 13/4.   His PVR is 58 mL.   Patient is still not completely satisfied with his urinary symptoms.  He would like to try  a different medication.  Since alpha-blockers have not provided benefit in the past and his PVR is minimal, I would like to try an OAB medication.  I have given him Toviaz 4 mg samples and he will return in one month for an IPSS score and PVR.  - BLADDER SCAN AMB NON-IMAGING   Return in about 1 month (around 12/26/2015) for IPSS score and PVR.  Zara Council, [facility] [address], [place], [place] [address] [phone]

## 2015-11-26 ENCOUNTER — Other Ambulatory Visit: Payer: Self-pay

## 2015-11-26 ENCOUNTER — Telehealth: Payer: Self-pay

## 2015-11-26 NOTE — Telephone Encounter (Signed)
Gastroenterology Pre-Procedure Review  Request Date: 12/18/15 Requesting Physician: Dr. Park Liter  PATIENT REVIEW QUESTIONS: The patient responded to the following health history questions as indicated:    1. Are you having any GI issues? no 2. Do you have a personal history of Polyps? no 3. Do you have a family history of Colon Cancer or Polyps? no 4. Diabetes Mellitus? no 5. Joint replacements in the past 12 months?no 6. Major health problems in the past 3 months?no 7. Any artificial heart valves, MVP, or defibrillator?no    MEDICATIONS & ALLERGIES:    Patient reports the following regarding taking any anticoagulation/antiplatelet therapy:   Plavix, Coumadin, Eliquis, Xarelto, Lovenox, Pradaxa, Brilinta, or Effient? no Aspirin? yes (ASA 81mg )  Patient confirms/reports the following medications:  Current Outpatient Prescriptions  Medication Sig Dispense Refill  . amLODipine (NORVASC) 10 MG tablet Take 1 tablet (10 mg total) by mouth daily. 90 tablet 1  . aspirin EC 81 MG tablet Take 81 mg by mouth daily.    . hydrochlorothiazide (HYDRODIURIL) 25 MG tablet Take 1 tablet (25 mg total) by mouth daily. 90 tablet 1  . ketoconazole (NIZORAL) 2 % cream APPLY ON THE SKIN TWICE A [place] (AFFECTED AREA ON CHEST)  2  . losartan (COZAAR) 50 MG tablet Take 1 tablet (50 mg total) by mouth daily. 90 tablet 1  . meloxicam (MOBIC) 15 MG tablet Take 1 tablet (15 mg total) by mouth daily. 90 tablet 1  . ranitidine (ZANTAC) 300 MG tablet Take 1 tablet (300 mg total) by mouth at bedtime. 90 tablet 1  . sildenafil (VIAGRA) 50 MG tablet Take 1 tablet (50 mg total) by mouth daily as needed for erectile dysfunction. 10 tablet 12   No current facility-administered medications for this visit.    Patient confirms/reports the following allergies:  No Known Allergies  No orders of the defined types were placed in this encounter.    AUTHORIZATION INFORMATION Primary Insurance: 1D#: Group #:  Secondary  Insurance: 1D#: Group #:  SCHEDULE INFORMATION: Date: 12/18/15 Time: Location: [place]

## 2015-11-30 DIAGNOSIS — N529 Male erectile dysfunction, unspecified: Secondary | ICD-10-CM | POA: Insufficient documentation

## 2015-12-01 ENCOUNTER — Encounter: Payer: Self-pay | Admitting: *Deleted

## 2015-12-09 NOTE — Discharge Instructions (Signed)
General Anesthesia, Adult, Care After °Refer to this sheet in the next few weeks. These instructions provide you with information on caring for yourself after your procedure. Your health care provider may also give you more specific instructions. Your treatment has been planned according to current medical practices, but problems sometimes occur. Call your health care provider if you have any problems or questions after your procedure. °WHAT TO EXPECT AFTER THE PROCEDURE °After the procedure, it is typical to experience: °· Sleepiness. °· Nausea and vomiting. °HOME CARE INSTRUCTIONS °· For the first 24 hours after general anesthesia: °¨ Have a responsible person with you. °¨ Do not drive a car. If you are alone, do not take public transportation. °¨ Do not drink alcohol. °¨ Do not take medicine that has not been prescribed by your health care provider. °¨ Do not sign important papers or make important decisions. °¨ You may resume a normal diet and activities as directed by your health care provider. °· Change bandages (dressings) as directed. °· If you have questions or problems that seem related to general anesthesia, call the hospital and ask for the anesthetist or anesthesiologist on call. °SEEK MEDICAL CARE IF: °· You have nausea and vomiting that continue the [place] after anesthesia. °· You develop a rash. °SEEK IMMEDIATE MEDICAL CARE IF:  °· You have difficulty breathing. °· You have chest pain. °· You have any allergic problems. °  °This information is not intended to replace advice given to you by your health care provider. Make sure you discuss any questions you have with your health care provider. °  °Document Released: 12/05/2000 Document Revised: 09/19/2014 Document Reviewed: 12/28/2011 °Elsevier Interactive Patient Education ©2016 [employer] ° °

## 2015-12-10 ENCOUNTER — Ambulatory Visit: Payer: 59 | Admitting: Anesthesiology

## 2015-12-10 ENCOUNTER — Ambulatory Visit
Admission: RE | Admit: 2015-12-10 | Discharge: 2015-12-10 | Disposition: A | Discharge status: Home / Self Care | Payer: 59 | Source: Ambulatory Visit | Attending: Gastroenterology | Admitting: Gastroenterology

## 2015-12-10 ENCOUNTER — Encounter
Admission: RE | Disposition: A | Discharge status: Home / Self Care | Payer: Self-pay | Source: Ambulatory Visit | Attending: Gastroenterology

## 2015-12-10 DIAGNOSIS — Z7982 Long term (current) use of aspirin: Secondary | ICD-10-CM | POA: Diagnosis not present

## 2015-12-10 DIAGNOSIS — K641 Second degree hemorrhoids: Secondary | ICD-10-CM | POA: Insufficient documentation

## 2015-12-10 DIAGNOSIS — Z801 Family history of malignant neoplasm of trachea, bronchus and lung: Secondary | ICD-10-CM | POA: Insufficient documentation

## 2015-12-10 DIAGNOSIS — Z79899 Other long term (current) drug therapy: Secondary | ICD-10-CM | POA: Insufficient documentation

## 2015-12-10 DIAGNOSIS — Z791 Long term (current) use of non-steroidal anti-inflammatories (NSAID): Secondary | ICD-10-CM | POA: Diagnosis not present

## 2015-12-10 DIAGNOSIS — Z85828 Personal history of other malignant neoplasm of skin: Secondary | ICD-10-CM | POA: Insufficient documentation

## 2015-12-10 DIAGNOSIS — R350 Frequency of micturition: Secondary | ICD-10-CM | POA: Diagnosis not present

## 2015-12-10 DIAGNOSIS — Z9889 Other specified postprocedural states: Secondary | ICD-10-CM | POA: Diagnosis not present

## 2015-12-10 DIAGNOSIS — N529 Male erectile dysfunction, unspecified: Secondary | ICD-10-CM | POA: Diagnosis not present

## 2015-12-10 DIAGNOSIS — I10 Essential (primary) hypertension: Secondary | ICD-10-CM | POA: Diagnosis not present

## 2015-12-10 DIAGNOSIS — R195 Other fecal abnormalities: Secondary | ICD-10-CM | POA: Diagnosis not present

## 2015-12-10 DIAGNOSIS — E785 Hyperlipidemia, unspecified: Secondary | ICD-10-CM | POA: Diagnosis not present

## 2015-12-10 DIAGNOSIS — Z87891 Personal history of nicotine dependence: Secondary | ICD-10-CM | POA: Diagnosis not present

## 2015-12-10 DIAGNOSIS — Z8349 Family history of other endocrine, nutritional and metabolic diseases: Secondary | ICD-10-CM | POA: Insufficient documentation

## 2015-12-10 DIAGNOSIS — N401 Enlarged prostate with lower urinary tract symptoms: Secondary | ICD-10-CM | POA: Insufficient documentation

## 2015-12-10 DIAGNOSIS — M1990 Unspecified osteoarthritis, unspecified site: Secondary | ICD-10-CM | POA: Diagnosis not present

## 2015-12-10 DIAGNOSIS — Z8249 Family history of ischemic heart disease and other diseases of the circulatory system: Secondary | ICD-10-CM | POA: Insufficient documentation

## 2015-12-10 DIAGNOSIS — D509 Iron deficiency anemia, unspecified: Secondary | ICD-10-CM | POA: Insufficient documentation

## 2015-12-10 DIAGNOSIS — D649 Anemia, unspecified: Secondary | ICD-10-CM | POA: Diagnosis present

## 2015-12-10 HISTORY — PX: COLONOSCOPY WITH PROPOFOL: SHX5780

## 2015-12-10 SURGERY — COLONOSCOPY WITH PROPOFOL
Anesthesia: Monitor Anesthesia Care | Wound class: Contaminated

## 2015-12-10 MED ORDER — ONDANSETRON HCL 4 MG/2ML IJ SOLN: 4.0000 mg | Freq: Once | INTRAMUSCULAR | Status: DC | PRN

## 2015-12-10 MED ORDER — LIDOCAINE HCL (CARDIAC) 20 MG/ML IV SOLN
INTRAVENOUS | Status: DC | PRN
  Administered 2015-12-10: 40 mg via INTRAVENOUS

## 2015-12-10 MED ORDER — SODIUM CHLORIDE 0.9 % IV SOLN: INTRAVENOUS | Status: DC

## 2015-12-10 MED ORDER — ACETAMINOPHEN 325 MG PO TABS: 325.0000 mg | ORAL_TABLET | ORAL | Status: DC | PRN

## 2015-12-10 MED ORDER — ACETAMINOPHEN 160 MG/5ML PO SOLN: 325.0000 mg | ORAL | Status: DC | PRN

## 2015-12-10 MED ORDER — LACTATED RINGERS IV SOLN
INTRAVENOUS | Status: DC
  Administered 2015-12-10: 08:00:00 via INTRAVENOUS

## 2015-12-10 MED ORDER — PROPOFOL 10 MG/ML IV BOLUS
INTRAVENOUS | Status: DC | PRN
  Administered 2015-12-10 (×2): 20 mg via INTRAVENOUS
  Administered 2015-12-10: 70 mg via INTRAVENOUS
  Administered 2015-12-10 (×3): 20 mg via INTRAVENOUS
  Administered 2015-12-10: 30 mg via INTRAVENOUS
  Administered 2015-12-10 (×2): 20 mg via INTRAVENOUS

## 2015-12-10 SURGICAL SUPPLY — 21 items
CANISTER SUCT 1200ML W/VALVE (MISCELLANEOUS) ×2 IMPLANT
CLIP HMST 235XBRD CATH ROT (MISCELLANEOUS) IMPLANT
CLIP RESOLUTION 360 11X235 (MISCELLANEOUS)
FCP ESCP3.2XJMB 240X2.8X (MISCELLANEOUS)
FORCEPS BIOP RAD 4 LRG CAP 4 (CUTTING FORCEPS) IMPLANT
FORCEPS BIOP RJ4 240 W/NDL (MISCELLANEOUS)
FORCEPS ESCP3.2XJMB 240X2.8X (MISCELLANEOUS) IMPLANT
GOWN CVR UNV OPN BCK APRN NK (MISCELLANEOUS) ×2 IMPLANT
GOWN ISOL THUMB LOOP REG UNIV (MISCELLANEOUS) ×2
INJECTOR VARIJECT VIN23 (MISCELLANEOUS) IMPLANT
KIT DEFENDO VALVE AND CONN (KITS) IMPLANT
KIT ENDO PROCEDURE OLY (KITS) ×2 IMPLANT
MARKER SPOT ENDO TATTOO 5ML (MISCELLANEOUS) IMPLANT
PAD GROUND ADULT SPLIT (MISCELLANEOUS) IMPLANT
PROBE APC STR FIRE (PROBE) ×2 IMPLANT
SNARE SHORT THROW 13M SML OVAL (MISCELLANEOUS) IMPLANT
SNARE SHORT THROW 30M LRG OVAL (MISCELLANEOUS) IMPLANT
SPOT EX ENDOSCOPIC TATTOO (MISCELLANEOUS)
VARIJECT INJECTOR VIN23 (MISCELLANEOUS)
WATER STERILE IRR 250ML POUR (IV SOLUTION) ×2 IMPLANT
WIDE-EYE POLYPTRAP (MISCELLANEOUS) IMPLANT

## 2015-12-10 NOTE — Op Note (Signed)
[facility] Gastroenterology Patient Name: Richard Dean Procedure Date: 12/10/2015 9:08 AM MRN: [id number] Account #: [id number] Date of Birth: 09/21/1950 Admit Type: Outpatient Age: 65 Room: [facility] OR ROOM 01 Gender: Male Note Status: Finalized Procedure:            Colonoscopy Indications:          Gastrointestinal occult blood loss, Iron deficiency                        anemia Providers:            Lucilla Lame, MD Referring MD:         Kathrine Haddock, PA (Referring MD) Medicines:            Propofol per Anesthesia Complications:        No immediate complications. Procedure:            Pre-Anesthesia Assessment:                       - Prior to the procedure, a History and Physical was                        performed, and patient medications and allergies were                        reviewed. The patient's tolerance of previous                        anesthesia was also reviewed. The risks and benefits of                        the procedure and the sedation options and risks were                        discussed with the patient. All questions were                        answered, and informed consent was obtained. Prior                        Anticoagulants: The patient has taken no previous                        anticoagulant or antiplatelet agents. ASA Grade                        Assessment: II - A patient with mild systemic disease.                        After reviewing the risks and benefits, the patient was                        deemed in satisfactory condition to undergo the                        procedure.                       After obtaining informed consent, the colonoscope was  passed under direct vision. Throughout the procedure,                        the patient's blood pressure, pulse, and oxygen                        saturations were monitored continuously. The Olympus CF                        H180AL colonoscope (S#:  [id number]) was introduced through                        the anus and advanced to the the cecum, identified by                        appendiceal orifice and ileocecal valve. The                        colonoscopy was performed without difficulty. The                        patient tolerated the procedure well. The quality of                        the bowel preparation was excellent. Findings:      The perianal and digital rectal examinations were normal.      Non-bleeding internal hemorrhoids were found during retroflexion. The       hemorrhoids were Grade II (internal hemorrhoids that prolapse but reduce       spontaneously). Impression:           - Non-bleeding internal hemorrhoids.                       - No specimens collected. Recommendation:       - Repeat colonoscopy in 10 years for screening unless                        any change in family history or lower GI problems. Procedure Code(s):    --- Professional ---                       [phone], Colonoscopy, flexible; diagnostic, including                        collection of specimen(s) by brushing or washing, when                        performed (separate procedure) Diagnosis Code(s):    --- Professional ---                       R19.5, Other fecal abnormalities                       D50.9, Iron deficiency anemia, unspecified CPT copyright 2016 American Medical Association. All rights reserved. The codes documented in this report are preliminary and upon coder review may  be revised to meet current compliance requirements. Lucilla Lame, MD 12/10/2015 9:29:01 AM This report has been signed electronically. Number of Addenda: 0 Note Initiated On: 12/10/2015 9:08 AM Scope Withdrawal Time: 0 hours 8 minutes 33  seconds  Total Procedure Duration: 0 hours 11 minutes 58 seconds       [facility]

## 2015-12-10 NOTE — Anesthesia Procedure Notes (Signed)
Procedure Name: MAC Performed by: Teresia Myint Pre-anesthesia Checklist: Patient identified, Emergency Drugs available, Suction available, Timeout performed and Patient being monitored Patient Re-evaluated:Patient Re-evaluated prior to inductionOxygen Delivery Method: Nasal cannula Placement Confirmation: positive ETCO2       

## 2015-12-10 NOTE — Transfer of Care (Signed)
Immediate Anesthesia Transfer of Care Note  Patient: Richard Dean  Procedure(s) Performed: Procedure(s): COLONOSCOPY WITH PROPOFOL (N/A)  Patient Location: PACU  Anesthesia Type: MAC  Level of Consciousness: awake, alert  and patient cooperative  Airway and Oxygen Therapy: Patient Spontanous Breathing and Patient connected to supplemental oxygen  Post-op Assessment: Post-op Vital signs reviewed, Patient's Cardiovascular Status Stable, Respiratory Function Stable, Patent Airway and No signs of Nausea or vomiting  Post-op Vital Signs: Reviewed and stable  Complications: No apparent anesthesia complications

## 2015-12-10 NOTE — H&P (Signed)
[facility]  [address], [place], [place] [address] Phone: [phone] Fax : [phone]  Primary Care Physician:  Kathrine Haddock, NP Primary Gastroenterologist:  Dr. Allen Norris  Pre-Procedure History & Physical: HPI:  Richard Dean is a 65 y.o. male is here for an colonoscopy.   Past Medical History  Diagnosis Date  . History of skin cancer   . Heart murmur   . Hypertension   . HLD (hyperlipidemia)   . Osteoarthritis   . BPH without obstruction/lower urinary tract symptoms   . Urinary frequency   . Hypogonadism in male   . Erectile dysfunction     Past Surgical History  Procedure Laterality Date  . Basal cell carcinoma excision      Head  . Spider bite Left arm    major reconstructive surgery for gangrene  . Circumscision      Prior to Admission medications   Medication Sig Start Date End Date Taking? Authorizing Provider  amLODipine (NORVASC) 10 MG tablet Take 1 tablet (10 mg total) by mouth daily. 11/10/15  Yes Kathrine Haddock, NP  aspirin EC 81 MG tablet Take 81 mg by mouth daily.   Yes Historical Provider, MD  BEE POLLEN PO Take 1 tablet by mouth daily.   Yes Historical Provider, MD  cholecalciferol (VITAMIN D) 1000 units tablet Take 1,000 Units by mouth daily.   Yes Historical Provider, MD  fesoterodine (TOVIAZ) 4 MG TB24 tablet Take 4 mg by mouth daily.   Yes Historical Provider, MD  hydrochlorothiazide (HYDRODIURIL) 25 MG tablet Take 1 tablet (25 mg total) by mouth daily. 11/10/15  Yes Kathrine Haddock, NP  ketoconazole (NIZORAL) 2 % cream APPLY ON THE SKIN TWICE A Kasler (AFFECTED AREA ON CHEST) 01/14/15  Yes Historical Provider, MD  losartan (COZAAR) 50 MG tablet Take 1 tablet (50 mg total) by mouth daily. 11/10/15  Yes Kathrine Haddock, NP  meloxicam (MOBIC) 15 MG tablet Take 1 tablet (15 mg total) by mouth daily. 11/10/15  Yes Kathrine Haddock, NP  ranitidine (ZANTAC) 300 MG tablet Take 1 tablet (300 mg total) by mouth at bedtime. 11/10/15  Yes Kathrine Haddock, NP    selenium 50 MCG TABS tablet Take 50 mcg by mouth daily.   Yes Historical Provider, MD  sildenafil (VIAGRA) 50 MG tablet Take 1 tablet (50 mg total) by mouth daily as needed for erectile dysfunction. 05/05/15  Yes Kathrine Haddock, NP    Allergies as of 11/26/2015  . (No Known Allergies)    Family History  Problem Relation Age of Onset  . Hypertension Mother   . Cancer Father     lung  . Heart disease Father     MI  . Hypertension Brother   . Allergies Daughter   . Hypertension Son   . Thyroid disease Son   . Hypertension Maternal Grandmother   . Hypertension Maternal Grandfather   . Hypertension Brother     Social History   Social History  . Marital Status: Married    Spouse Name: N/A  . Number of Children: N/A  . Years of Education: N/A   Occupational History  . Not on file.   Social History Main Topics  . Smoking status: Former Smoker    Types: Cigarettes    Quit date: 03/30/1995  . Smokeless tobacco: Never Used  . Alcohol Use: No  . Drug Use: No  . Sexual Activity: Yes   Other Topics Concern  . Not on file   Social History Narrative  Review of Systems: See HPI, otherwise negative ROS  Physical Exam: BP 157/87 mmHg  Pulse 99  Temp(Src) 97.5 F (36.4 C) (Temporal)  Resp 17  Ht 5\' 9"  (1.753 m)  Wt 209 lb (94.802 kg)  BMI 30.85 kg/m2  SpO2 100% General:   Alert,  pleasant and cooperative in NAD Head:  Normocephalic and atraumatic. Neck:  Supple; no masses or thyromegaly. Lungs:  Clear throughout to auscultation.    Heart:  Regular rate and rhythm. Abdomen:  Soft, nontender and nondistended. Normal bowel sounds, without guarding, and without rebound.   Neurologic:  Alert and  oriented x4;  grossly normal neurologically.  Impression/Plan: Richard Dean is here for an colonoscopy to be performed for anemia and heme positive stools  Risks, benefits, limitations, and alternatives regarding  colonoscopy have been reviewed with the patient.  Questions  have been answered.  All parties agreeable.   Ollen Bowl, MD  12/10/2015, 9:04 AM

## 2015-12-10 NOTE — Anesthesia Preprocedure Evaluation (Signed)
Anesthesia Evaluation  Patient identified by MRN, date of birth, ID band Patient awake    Reviewed: Allergy & Precautions, NPO status , Patient's Chart, lab work & pertinent test results, reviewed documented beta blocker date and time   Airway Mallampati: II  TM Distance: >3 FB Neck ROM: Full    Dental no notable dental hx.    Pulmonary former smoker,    Pulmonary exam normal        Cardiovascular hypertension, Pt. on medications Normal cardiovascular exam     Neuro/Psych negative neurological ROS  negative psych ROS   GI/Hepatic negative GI ROS, Neg liver ROS,   Endo/Other  negative endocrine ROS  Renal/GU negative Renal ROS     Musculoskeletal  (+) Arthritis , Osteoarthritis,    Abdominal   Peds  Hematology negative hematology ROS (+)   Anesthesia Other Findings   Reproductive/Obstetrics                             Anesthesia Physical Anesthesia Plan  ASA: II  Anesthesia Plan: MAC   Post-op Pain Management:    Induction: Intravenous  Airway Management Planned:   Additional Equipment:   Intra-op Plan:   Post-operative Plan:   Informed Consent: I have reviewed the patients History and Physical, chart, labs and discussed the procedure including the risks, benefits and alternatives for the proposed anesthesia with the patient or authorized representative who has indicated his/her understanding and acceptance.     Plan Discussed with: CRNA  Anesthesia Plan Comments:         Anesthesia Quick Evaluation

## 2015-12-10 NOTE — Anesthesia Postprocedure Evaluation (Signed)
Anesthesia Post Note  Patient: Chuckie Hassey  Procedure(s) Performed: Procedure(s) (LRB): COLONOSCOPY WITH PROPOFOL (N/A)  Patient location during evaluation: PACU Anesthesia Type: MAC Level of consciousness: awake and alert and oriented Pain management: pain level controlled Vital Signs Assessment: post-procedure vital signs reviewed and stable Respiratory status: spontaneous breathing and nonlabored ventilation Cardiovascular status: stable Postop Assessment: no signs of nausea or vomiting and adequate PO intake Anesthetic complications: no    Estill Batten

## 2015-12-11 ENCOUNTER — Encounter: Payer: Self-pay | Admitting: Gastroenterology

## 2015-12-28 ENCOUNTER — Encounter: Payer: Self-pay | Admitting: Urology

## 2015-12-28 ENCOUNTER — Ambulatory Visit (INDEPENDENT_AMBULATORY_CARE_PROVIDER_SITE_OTHER): Payer: 59 | Admitting: Urology

## 2015-12-28 VITALS — BP 150/91 | HR 99 | Ht 69.0 in | Wt 219.8 lb

## 2015-12-28 DIAGNOSIS — N529 Male erectile dysfunction, unspecified: Secondary | ICD-10-CM

## 2015-12-28 DIAGNOSIS — N400 Enlarged prostate without lower urinary tract symptoms: Secondary | ICD-10-CM | POA: Diagnosis not present

## 2015-12-28 DIAGNOSIS — N401 Enlarged prostate with lower urinary tract symptoms: Secondary | ICD-10-CM

## 2015-12-28 DIAGNOSIS — N528 Other male erectile dysfunction: Secondary | ICD-10-CM

## 2015-12-28 DIAGNOSIS — N138 Other obstructive and reflux uropathy: Secondary | ICD-10-CM

## 2015-12-28 LAB — BLADDER SCAN AMB NON-IMAGING: Scan Result: 39

## 2015-12-28 MED ORDER — OXYBUTYNIN CHLORIDE ER 10 MG PO TB24: 10.0000 mg | ORAL_TABLET | Freq: Every day | ORAL | Status: DC

## 2015-12-28 NOTE — Progress Notes (Signed)
5:53 AM   Richard Dean 1951-08-31 [id number]  Referring provider: Kathrine Haddock, NP [place], [place] [address]  Chief Complaint  Patient presents with  . Benign Prostatic Hypertrophy    1 month follow up     HPI: Patient is a 65 year old Caucasian male with erectile dysfunction and BPH and LUTS who presents today for Follow-up after a 1 month trial with Toviaz 4 mg daily for his urinary frequency, urgency and urge incontinence..  Previous history Patient with BPH and LUTS who was on Flomax, but was having urinary frequency, urgency and urge incontinence. He was found to be drinking large amounts of diet soda. It was suggested that he remove diet sodas from his diet and return in 3 months for uroflow/PVR.  He did not complete a uroflow because he urinated prior to coming to the appointment and was found to have a low residual.  He did not completely eliminate his diet soda consumption, but he did reduce it drastically. He has seen an improvement in his urinary urgency, frequency and urge incontinence. He states he has a bad [place] once in a week's time.  He was changed to Rapaflo, but he did not find significant improvement and it was cost prohibitive.  Erectile dysfunction He has been having difficulty with erections for the last few years.   His major complaint is maintaining an erection.  His libido is preserved.   His risk factors for ED are HTN, age, hyperlipidemia and increased BMI.  He denies any painful erections or curvatures with his erections.   He has tried Viagra in the past with good results.  He has had no changes that his erectile dysfunction since his visit with [place] 1 month ago  BPH WITH LUTS His IPSS score today is 12, which is moderate lower urinary tract symptomatology. He is mixed with his quality life due to his urinary symptoms.  His PVR is 39 mL.   His previous I PSS score was 13/4 and his previous PVR was 58 mL.  He states he did notice an improvement in his urinary  frequency, urgency and urge incontinence with the Toviaz 4 mg daily.  He would like to continue the medication, but he states that it is cost prohibitive and would like a more economical option.  He denies any dysuria, hematuria or suprapubic pain.   He also denies any recent fevers, chills, nausea or vomiting.  He does not have a family history of PCa.      IPSS      11/25/15 1500 12/28/15 1500     International Prostate Symptom Score   How often have you had the sensation of not emptying your bladder? Less than half the time Less than half the time    How often have you had to urinate less than every two hours? Less than half the time About half the time    How often have you found you stopped and started again several times when you urinated? Less than 1 in 5 times Less than 1 in 5 times    How often have you found it difficult to postpone urination? More than half the time Less than 1 in 5 times    How often have you had a weak urinary stream? Less than 1 in 5 times Less than 1 in 5 times    How often have you had to strain to start urination? Less than 1 in 5 times Less than 1 in 5  times    How many times did you typically get up at night to urinate? 2 Times 3 Times    Total IPSS Score 13 12    Quality of Life due to urinary symptoms   If you were to spend the rest of your life with your urinary condition just the way it is now how would you feel about that? Mostly Disatisfied Mixed       Score:  1-7 Mild 8-19 Moderate 20-35 Severe  PMH: Past Medical History  Diagnosis Date  . History of skin cancer   . Heart murmur   . Hypertension   . HLD (hyperlipidemia)   . Osteoarthritis   . BPH without obstruction/lower urinary tract symptoms   . Urinary frequency   . Hypogonadism in male   . Erectile dysfunction     Surgical History: Past Surgical History  Procedure Laterality Date  . Basal cell carcinoma excision      Head  . Spider bite Left arm    major reconstructive  surgery for gangrene  . Circumscision    . Colonoscopy with propofol N/A 12/10/2015    Procedure: COLONOSCOPY WITH PROPOFOL;  Surgeon: Lucilla Lame, MD;  Location: [facility];  Service: Endoscopy;  Laterality: N/A;    Home Medications:    Medication List       This list is accurate as of: 12/28/15 11:59 PM.  Always use your most recent med list.               amLODipine 10 MG tablet  Commonly known as:  NORVASC  Take 1 tablet (10 mg total) by mouth daily.     aspirin EC 81 MG tablet  Take 81 mg by mouth daily.     BEE POLLEN PO  Take 1 tablet by mouth daily.     cholecalciferol 1000 units tablet  Commonly known as:  VITAMIN D  Take 1,000 Units by mouth daily.     hydrochlorothiazide 25 MG tablet  Commonly known as:  HYDRODIURIL  Take 1 tablet (25 mg total) by mouth daily.     Hydrocodone-Acetaminophen 5-300 MG Tabs  Take 1 tablet by mouth every 6 (six) hours as needed. for pain     ketoconazole 2 % cream  Commonly known as:  NIZORAL  APPLY ON THE SKIN TWICE A Kozlov (AFFECTED AREA ON CHEST)     losartan 50 MG tablet  Commonly known as:  COZAAR  Take 1 tablet (50 mg total) by mouth daily.     meloxicam 15 MG tablet  Commonly known as:  MOBIC  Take 1 tablet (15 mg total) by mouth daily.     oxybutynin 10 MG 24 hr tablet  Commonly known as:  DITROPAN-XL  Take 1 tablet (10 mg total) by mouth at bedtime.     ranitidine 300 MG tablet  Commonly known as:  ZANTAC  Take 1 tablet (300 mg total) by mouth at bedtime.     selenium 50 MCG Tabs tablet  Take 50 mcg by mouth daily.     sildenafil 50 MG tablet  Commonly known as:  VIAGRA  Take 1 tablet (50 mg total) by mouth daily as needed for erectile dysfunction.        Allergies: No Known Allergies  Family History: Family History  Problem Relation Age of Onset  . Hypertension Mother   . Cancer Father     lung  . Heart disease Father     MI  . Hypertension  Brother   . Allergies Daughter   .  Hypertension Son   . Thyroid disease Son   . Hypertension Maternal Grandmother   . Hypertension Maternal Grandfather   . Hypertension Brother   . Kidney disease Neg Hx   . Prostate cancer Neg Hx     Social History:  reports that he quit smoking about 20 years ago. His smoking use included Cigarettes. He has never used smokeless tobacco. He reports that he does not drink alcohol or use illicit drugs.  ROS: Urological Symptom Review  Patient is experiencing the following symptoms: Frequent urination Hard to postpone urination Get up at night to urinate Leakage of urine Stream starts and stops Erection problems (male only)   Review of Systems  Gastrointestinal (upper)  : Indigestion/heartburn  Gastrointestinal (lower) : Negative for lower GI symptoms  Constitutional : Negative for symptoms  Skin: Negative for skin symptoms  Eyes: Negative for eye symptoms  Ear/Nose/Throat : Negative for Ear/Nose/Throat symptoms  Hematologic/Lymphatic: Negative for Hematologic/Lymphatic symptoms  Cardiovascular : Negative for cardiovascular symptoms  Respiratory : Negative for respiratory symptoms  Endocrine: Negative for endocrine symptoms  Musculoskeletal: Joint pain  Neurological: Negative for neurological symptoms  Psychologic: Negative for psychiatric symptoms  Physical Exam: BP 150/91 mmHg  Pulse 99  Ht 5\' 9"  (1.753 m)  Wt 219 lb 12.8 oz (99.701 kg)  BMI 32.44 kg/m2  Constitutional: Well nourished. Alert and oriented, No acute distress. HEENT: [place] AT, moist mucus membranes. Trachea midline, no masses. Cardiovascular: No clubbing, cyanosis, or edema. Respiratory: Normal respiratory effort, no increased work of breathing. Skin: No rashes, bruises or suspicious lesions. Lymph: No cervical or inguinal adenopathy. Neurologic: Grossly intact, no focal deficits, moving all 4 extremities. Psychiatric: Normal mood and affect.  Laboratory Data: Lab Results    Component Value Date   WBC 6.2 11/10/2015   HCT 37.4* 11/10/2015   MCV 78* 11/10/2015   PLT 170 11/10/2015   Lab Results  Component Value Date   CREATININE 1.04 11/10/2015   PSA History  1.1 ng/mL on 11/10/2015  Pertinent imaging Results for Richard Dean, Richard Dean (MRN [id number]) as of 01/03/2016 05:44  Ref. Range 12/28/2015 15:48  Scan Result Unknown 39    Assessment & Plan:   1. Erectile dysfunction:   No changes erectile dysfunction since seen 1 month ago.   Patient is satisfied with the Viagra. He is not in need of a refill at this time.   We will obtain an SHIM questionnaire and exam in 1 year.   2. BPH (benign prostatic hyperplasia) with LUTS:   His IPSS today is 12/3.   His PVR is 39 mL.  Patient would like to continue therapy with an anticholinergic, but he finds [place] cost prohibitive.  I prescribed him oxybutynin XL 10 mg daily.  Did advise him that this medication may cause mental status changes in his age group. Will contact the office if he should experience any untoward side effects.  Otherwise he will return in February for I PSS score, PSA, PVR and exam.  - BLADDER SCAN AMB NON-IMAGING  Return in about 10 months (around 10/29/2016) for IPSS, SHIM, PVR and exam.  Zara Council, [facility]  [facility] [address] [place], [place] [address] [phone]

## 2016-01-03 ENCOUNTER — Encounter: Payer: Self-pay | Admitting: Urology

## 2016-04-28 ENCOUNTER — Other Ambulatory Visit: Payer: Self-pay | Admitting: Unknown Physician Specialty

## 2016-04-29 NOTE — Telephone Encounter (Signed)
Your patient 

## 2016-05-09 ENCOUNTER — Ambulatory Visit (INDEPENDENT_AMBULATORY_CARE_PROVIDER_SITE_OTHER): Payer: 59 | Admitting: Unknown Physician Specialty

## 2016-05-09 ENCOUNTER — Encounter: Payer: Self-pay | Admitting: Unknown Physician Specialty

## 2016-05-09 DIAGNOSIS — K219 Gastro-esophageal reflux disease without esophagitis: Secondary | ICD-10-CM

## 2016-05-09 DIAGNOSIS — D649 Anemia, unspecified: Secondary | ICD-10-CM | POA: Diagnosis not present

## 2016-05-09 DIAGNOSIS — M8949 Other hypertrophic osteoarthropathy, multiple sites: Secondary | ICD-10-CM

## 2016-05-09 DIAGNOSIS — M150 Primary generalized (osteo)arthritis: Secondary | ICD-10-CM | POA: Diagnosis not present

## 2016-05-09 DIAGNOSIS — I10 Essential (primary) hypertension: Secondary | ICD-10-CM | POA: Diagnosis not present

## 2016-05-09 DIAGNOSIS — M159 Polyosteoarthritis, unspecified: Secondary | ICD-10-CM

## 2016-05-09 MED ORDER — AMLODIPINE BESYLATE 10 MG PO TABS: 10.0000 mg | ORAL_TABLET | Freq: Every day | ORAL | 1 refills | Status: DC

## 2016-05-09 MED ORDER — MELOXICAM 15 MG PO TABS: 15.0000 mg | ORAL_TABLET | Freq: Every day | ORAL | 1 refills | Status: DC

## 2016-05-09 MED ORDER — HYDROCHLOROTHIAZIDE 25 MG PO TABS: 25.0000 mg | ORAL_TABLET | Freq: Every day | ORAL | 1 refills | Status: DC

## 2016-05-09 MED ORDER — LOSARTAN POTASSIUM 50 MG PO TABS: 50.0000 mg | ORAL_TABLET | Freq: Every day | ORAL | 1 refills | Status: DC

## 2016-05-09 NOTE — Progress Notes (Signed)
BP 128/83 (BP Location: Left Arm, Patient Position: Sitting, Cuff Size: Large)   Pulse (!) 52   Temp 98.2 F (36.8 C)   Ht 5' 7.5" (1.715 m)   Wt 220 lb 12.8 oz (100.2 kg)   SpO2 97%   BMI 34.07 kg/m    Subjective:    Patient ID: Richard Dean, male    DOB: [birth year], 65 y.o.   MRN: [id number]  HPI: Richard Dean is a 64 y.o. male  Chief Complaint  Patient presents with  . Hypertension  . Pain  . Gastroesophageal Reflux   Hypertension Using medications without difficulty Average home BPs not checking                                        No problems or lightheadedness No chest pain with exertion or shortness of breath No Edema  Joint pain Pt taking Meloxicam for hand and knee pain.  He has tried to go off in the past but felt like he needed to go back on.  Taking Zantac for stomach protection but having more acid problems  Relevant past medical, surgical, family and social history reviewed and updated as indicated. Interim medical history since our last visit reviewed. Allergies and medications reviewed and updated.  Review of Systems  Per HPI unless specifically indicated above     Objective:    BP 128/83 (BP Location: Left Arm, Patient Position: Sitting, Cuff Size: Large)   Pulse (!) 52   Temp 98.2 F (36.8 C)   Ht 5' 7.5" (1.715 m)   Wt 220 lb 12.8 oz (100.2 kg)   SpO2 97%   BMI 34.07 kg/m   Wt Readings from Last 3 Encounters:  05/09/16 220 lb 12.8 oz (100.2 kg)  12/28/15 219 lb 12.8 oz (99.7 kg)  12/10/15 209 lb (94.8 kg)    Physical Exam  Constitutional: He is oriented to person, place, and time. He appears well-developed and well-nourished. No distress.  HENT:  Head: Normocephalic and atraumatic.  Eyes: Conjunctivae and lids are normal. Right eye exhibits no discharge. Left eye exhibits no discharge. No scleral icterus.  Neck: Normal range of motion. Neck supple. No JVD present. Carotid bruit is not present.  Cardiovascular: Normal rate, regular  rhythm and normal heart sounds.   Pulmonary/Chest: Effort normal and breath sounds normal. No respiratory distress.  Abdominal: Normal appearance. There is no splenomegaly or hepatomegaly.  Musculoskeletal: Normal range of motion.  Neurological: He is alert and oriented to person, place, and time.  Skin: Skin is warm, dry and intact. No rash noted. No pallor.  Psychiatric: He has a normal mood and affect. His behavior is normal. Judgment and thought content normal.    Results for orders placed or performed in visit on 12/28/15  BLADDER SCAN AMB NON-IMAGING  Result Value Ref Range   Scan Result 39       Assessment & Plan:   Problem List Items Addressed This Visit      Unprioritized   Anemia    GI work up normal.  Recheck today      Relevant Orders   CBC with Differential/Platelet   Essential hypertension    Stable, continue present medications.        Relevant Medications   amLODipine (NORVASC) 10 MG tablet   hydrochlorothiazide (HYDRODIURIL) 25 MG tablet   losartan (COZAAR) 50 MG tablet   Other Relevant  Orders   Comprehensive metabolic panel   GERD (gastroesophageal reflux disease)    Partly due to take Meloxicam pt feels that he needs.        Osteoarthritis    Doing well.  Will try to wean down Meloxicam      Relevant Medications   meloxicam (MOBIC) 15 MG tablet    Other Visit Diagnoses   None.      Follow up plan: No Follow-up on file.

## 2016-05-09 NOTE — Assessment & Plan Note (Signed)
Doing well.  Will try to wean down Meloxicam

## 2016-05-09 NOTE — Assessment & Plan Note (Signed)
Stable, continue present medications.   

## 2016-05-09 NOTE — Assessment & Plan Note (Signed)
GI work up normal.  Recheck today

## 2016-05-09 NOTE — Assessment & Plan Note (Signed)
Partly due to take Meloxicam pt feels that he needs.

## 2016-05-10 ENCOUNTER — Telehealth: Payer: Self-pay | Admitting: Unknown Physician Specialty

## 2016-05-10 DIAGNOSIS — D72829 Elevated white blood cell count, unspecified: Secondary | ICD-10-CM | POA: Insufficient documentation

## 2016-05-10 LAB — COMPREHENSIVE METABOLIC PANEL
ALT: 18 IU/L (ref 0–44)
AST: 15 IU/L (ref 0–40)
Albumin/Globulin Ratio: 2 (ref 1.2–2.2)
Albumin: 4.7 g/dL (ref 3.6–4.8)
Alkaline Phosphatase: 68 IU/L (ref 39–117)
BUN/Creatinine Ratio: 24 (ref 10–24)
BUN: 26 mg/dL (ref 8–27)
Bilirubin Total: 0.8 mg/dL (ref 0.0–1.2)
CO2: 25 mmol/L (ref 18–29)
Calcium: 9.2 mg/dL (ref 8.6–10.2)
Chloride: 98 mmol/L (ref 96–106)
Creatinine, Ser: 1.1 mg/dL (ref 0.76–1.27)
GFR calc Af Amer: 81 mL/min/{1.73_m2} (ref >59)
GFR calc non Af Amer: 70 mL/min/{1.73_m2} (ref >59)
Globulin, Total: 2.4 g/dL (ref 1.5–4.5)
Glucose: 104 mg/dL — ABNORMAL HIGH (ref 65–99)
Potassium: 4.5 mmol/L (ref 3.5–5.2)
Sodium: 140 mmol/L (ref 134–144)
Total Protein: 7.1 g/dL (ref 6.0–8.5)

## 2016-05-10 LAB — CBC WITH DIFFERENTIAL/PLATELET
Basophils Absolute: 0 10*3/uL (ref 0.0–0.2)
Basos: 0 %
EOS (ABSOLUTE): 0 10*3/uL (ref 0.0–0.4)
Eos: 0 %
Hematocrit: 37.9 % (ref 37.5–51.0)
Hemoglobin: 12.4 g/dL — ABNORMAL LOW (ref 12.6–17.7)
Immature Grans (Abs): 0.1 10*3/uL (ref 0.0–0.1)
Immature Granulocytes: 1 %
Lymphocytes Absolute: 4.5 10*3/uL — ABNORMAL HIGH (ref 0.7–3.1)
Lymphs: 29 %
MCH: 27.7 pg (ref 26.6–33.0)
MCHC: 32.7 g/dL (ref 31.5–35.7)
MCV: 85 fL (ref 79–97)
Monocytes Absolute: 0.8 10*3/uL (ref 0.1–0.9)
Monocytes: 5 %
Neutrophils Absolute: 10 10*3/uL — ABNORMAL HIGH (ref 1.4–7.0)
Neutrophils: 65 %
Platelets: 219 10*3/uL (ref 150–379)
RBC: 4.47 x10E6/uL (ref 4.14–5.80)
RDW: 15.4 % (ref 12.3–15.4)
WBC: 15.4 10*3/uL — ABNORMAL HIGH (ref 3.4–10.8)

## 2016-05-10 NOTE — Telephone Encounter (Signed)
Discussed with pt elevated white count.  Check CBC in 3-4 weeks.  States he is just getting over an ear infection treated by ENT

## 2016-06-21 ENCOUNTER — Telehealth: Payer: Self-pay

## 2016-06-21 NOTE — Telephone Encounter (Signed)
Patient notified. He will try to come in tomorrow.

## 2016-06-21 NOTE — Telephone Encounter (Signed)
-----   Message from Kathrine Haddock, NP sent at 06/15/2016  3:56 PM EDT ----- Pt due for a CBC ----- Message ----- From: SYSTEM Sent: 06/15/2016  12:05 AM To: Kathrine Haddock, NP

## 2016-06-22 ENCOUNTER — Other Ambulatory Visit: Payer: 59

## 2016-06-22 DIAGNOSIS — D72829 Elevated white blood cell count, unspecified: Secondary | ICD-10-CM

## 2016-06-23 LAB — CBC WITH DIFFERENTIAL/PLATELET
Basophils Absolute: 0 10*3/uL (ref 0.0–0.2)
Basos: 1 %
EOS (ABSOLUTE): 0.3 10*3/uL (ref 0.0–0.4)
Eos: 5 %
Hematocrit: 35.3 % — ABNORMAL LOW (ref 37.5–51.0)
Hemoglobin: 12.1 g/dL — ABNORMAL LOW (ref 12.6–17.7)
Immature Grans (Abs): 0 10*3/uL (ref 0.0–0.1)
Immature Granulocytes: 0 %
Lymphocytes Absolute: 3 10*3/uL (ref 0.7–3.1)
Lymphs: 43 %
MCH: 27.9 pg (ref 26.6–33.0)
MCHC: 34.3 g/dL (ref 31.5–35.7)
MCV: 82 fL (ref 79–97)
Monocytes Absolute: 0.5 10*3/uL (ref 0.1–0.9)
Monocytes: 7 %
Neutrophils Absolute: 3 10*3/uL (ref 1.4–7.0)
Neutrophils: 44 %
Platelets: 171 10*3/uL (ref 150–379)
RBC: 4.33 x10E6/uL (ref 4.14–5.80)
RDW: 15.2 % (ref 12.3–15.4)
WBC: 6.8 10*3/uL (ref 3.4–10.8)

## 2016-06-27 ENCOUNTER — Telehealth: Payer: Self-pay | Admitting: Unknown Physician Specialty

## 2016-06-27 NOTE — Telephone Encounter (Signed)
No answer

## 2016-06-28 ENCOUNTER — Encounter: Payer: Self-pay | Admitting: Unknown Physician Specialty

## 2016-06-28 ENCOUNTER — Telehealth: Payer: Self-pay | Admitting: Unknown Physician Specialty

## 2016-06-28 NOTE — Telephone Encounter (Signed)
Pt called needing an appointment to get a referral for a sleep study. Appt was made for pt.

## 2016-07-01 ENCOUNTER — Ambulatory Visit (INDEPENDENT_AMBULATORY_CARE_PROVIDER_SITE_OTHER): Payer: 59 | Admitting: Unknown Physician Specialty

## 2016-07-01 ENCOUNTER — Encounter: Payer: Self-pay | Admitting: Unknown Physician Specialty

## 2016-07-01 VITALS — BP 136/87 | HR 75 | Temp 98.0°F | Wt 223.0 lb

## 2016-07-01 DIAGNOSIS — Z23 Encounter for immunization: Secondary | ICD-10-CM | POA: Diagnosis not present

## 2016-07-01 DIAGNOSIS — N401 Enlarged prostate with lower urinary tract symptoms: Secondary | ICD-10-CM | POA: Diagnosis not present

## 2016-07-01 DIAGNOSIS — G4733 Obstructive sleep apnea (adult) (pediatric): Secondary | ICD-10-CM

## 2016-07-01 DIAGNOSIS — N138 Other obstructive and reflux uropathy: Secondary | ICD-10-CM

## 2016-07-01 MED ORDER — OXYBUTYNIN CHLORIDE ER 10 MG PO TB24: 10.0000 mg | ORAL_TABLET | Freq: Every day | ORAL | 3 refills | Status: DC

## 2016-07-01 NOTE — Assessment & Plan Note (Signed)
Refill Oxybutynin

## 2016-07-01 NOTE — Progress Notes (Signed)
BP 136/87   Pulse 75   Temp 98 F (36.7 C)   Wt 223 lb (101.2 kg)   SpO2 97%   BMI 34.41 kg/m    Subjective:    Patient ID: Richard Dean, male    DOB: [birth year], 65 y.o.   MRN: [id number]  HPI: Richard Dean is a 65 y.o. male  Chief Complaint  Patient presents with  . Referral    SLEEP STUDY--SNORING MORE LATELY   Sleep apnea Pt having problems he has noted for a month or two.  Finds a tendency to "not off" while he is driving.  Snoring loudly at night.  Has hight blood pressure and continuing to gain weight.  Feels sleepy later in the [place].  Goes to sleep immediately when lies down.    BPH Receiving Oxybutin from urology and wants a refill   Relevant past medical, surgical, family and social history reviewed and updated as indicated. Interim medical history since our last visit reviewed. Allergies and medications reviewed and updated.  Review of Systems  Per HPI unless specifically indicated above     Objective:    BP 136/87   Pulse 75   Temp 98 F (36.7 C)   Wt 223 lb (101.2 kg)   SpO2 97%   BMI 34.41 kg/m   Wt Readings from Last 3 Encounters:  07/01/16 223 lb (101.2 kg)  05/09/16 220 lb 12.8 oz (100.2 kg)  12/28/15 219 lb 12.8 oz (99.7 kg)    Physical Exam  Constitutional: He is oriented to person, place, and time. He appears well-developed and well-nourished. No distress.  HENT:  Head: Normocephalic and atraumatic.  Eyes: Conjunctivae and lids are normal. Right eye exhibits no discharge. Left eye exhibits no discharge. No scleral icterus.  Cardiovascular: Normal rate.   Pulmonary/Chest: Effort normal.  Abdominal: Normal appearance. There is no splenomegaly or hepatomegaly.  Musculoskeletal: Normal range of motion.  Neurological: He is alert and oriented to person, place, and time.  Skin: Skin is intact. No rash noted. No pallor.  Psychiatric: He has a normal mood and affect. His behavior is normal. Judgment and thought content normal.    Results for  orders placed or performed in visit on 06/22/16  CBC with Differential/Platelet  Result Value Ref Range   WBC 6.8 3.4 - 10.8 x10E3/uL   RBC 4.33 4.14 - 5.80 x10E6/uL   Hemoglobin 12.1 (L) 12.6 - 17.7 g/dL   Hematocrit 35.3 (L) 37.5 - 51.0 %   MCV 82 79 - 97 fL   MCH 27.9 26.6 - 33.0 pg   MCHC 34.3 31.5 - 35.7 g/dL   RDW 15.2 12.3 - 15.4 %   Platelets 171 150 - 379 x10E3/uL   Neutrophils 44 Not Estab. %   Lymphs 43 Not Estab. %   Monocytes 7 Not Estab. %   Eos 5 Not Estab. %   Basos 1 Not Estab. %   Neutrophils Absolute 3.0 1.4 - 7.0 x10E3/uL   Lymphocytes Absolute 3.0 0.7 - 3.1 x10E3/uL   Monocytes Absolute 0.5 0.1 - 0.9 x10E3/uL   EOS (ABSOLUTE) 0.3 0.0 - 0.4 x10E3/uL   Basophils Absolute 0.0 0.0 - 0.2 x10E3/uL   Immature Granulocytes 0 Not Estab. %   Immature Grans (Abs) 0.0 0.0 - 0.1 x10E3/uL      Assessment & Plan:   Problem List Items Addressed This Visit      Unprioritized   BPH with obstruction/lower urinary tract symptoms    Refill Oxybutynin  Other Visit Diagnoses    Obstructive sleep apnea syndrome    -  Primary   Relevant Orders   Ambulatory referral to Sleep Studies   Encounter for immunization       Relevant Orders   Flu vaccine HIGH DOSE PF (Completed)       Follow up plan: Return if symptoms worsen or fail to improve.

## 2016-07-05 ENCOUNTER — Other Ambulatory Visit: Payer: Self-pay | Admitting: Unknown Physician Specialty

## 2016-07-07 ENCOUNTER — Telehealth: Payer: Self-pay

## 2016-07-07 NOTE — Telephone Encounter (Signed)
Called and made sure that patient was aware of his appointment with Sleep Med. Patient stated that they called him yesterday and that he is aware of appointment.

## 2016-07-07 NOTE — Telephone Encounter (Signed)
-----   Message from Tula Nakayama sent at 07/07/2016  9:12 AM EDT ----- Referral Appointment

## 2016-09-09 ENCOUNTER — Telehealth: Payer: Self-pay | Admitting: Unknown Physician Specialty

## 2016-09-09 DIAGNOSIS — G4733 Obstructive sleep apnea (adult) (pediatric): Secondary | ICD-10-CM

## 2016-09-09 DIAGNOSIS — G4730 Sleep apnea, unspecified: Secondary | ICD-10-CM | POA: Insufficient documentation

## 2016-09-09 NOTE — Telephone Encounter (Signed)
Discussed with pt positve sleep study.  Will refer to ENT for further management

## 2016-10-11 ENCOUNTER — Other Ambulatory Visit: Payer: Self-pay | Admitting: Unknown Physician Specialty

## 2016-10-24 ENCOUNTER — Other Ambulatory Visit: Payer: 59

## 2016-10-24 ENCOUNTER — Other Ambulatory Visit: Payer: Self-pay

## 2016-10-24 DIAGNOSIS — N401 Enlarged prostate with lower urinary tract symptoms: Secondary | ICD-10-CM

## 2016-10-25 LAB — PSA: Prostate Specific Ag, Serum: 1.1 ng/mL (ref 0.0–4.0)

## 2016-10-31 ENCOUNTER — Ambulatory Visit: Payer: 59 | Admitting: Urology

## 2016-11-02 ENCOUNTER — Ambulatory Visit: Payer: 59 | Admitting: Urology

## 2016-11-02 ENCOUNTER — Encounter: Payer: Self-pay | Admitting: Urology

## 2016-11-02 NOTE — Progress Notes (Deleted)
1:55 PM   Richard Dean Jan 25, 1951 [id number]  Referring provider: Kathrine Haddock, NP [place], [place] [address]  No chief complaint on file.   HPI: Patient is a 66 year old Caucasian male with erectile dysfunction and BPH and LUTS who presents today for one year follow up.    Previous history Patient with BPH and LUTS who was on Flomax, but was having urinary frequency, urgency and urge incontinence. He was found to be drinking large amounts of diet soda. It was suggested that he remove diet sodas from his diet and return in 3 months for uroflow/PVR.  He did not complete a uroflow because he urinated prior to coming to the appointment and was found to have a low residual.  He did not completely eliminate his diet soda consumption, but he did reduce it drastically. He has seen an improvement in his urinary urgency, frequency and urge incontinence. He states he has a bad [place] once in a week's time.  He was changed to Rapaflo, but he did not find significant improvement and it was cost prohibitive.  Erectile dysfunction He has been having difficulty with erections for the last few years.   His major complaint is maintaining an erection.  His libido is preserved.   His risk factors for ED are HTN, age, hyperlipidemia and increased BMI.  He denies any painful erections or curvatures with his erections.   He has tried Viagra in the past with good results.  He has had no changes that his erectile dysfunction since his visit with [place] 1 month ago  BPH WITH LUTS His IPSS score today is 12, which is moderate lower urinary tract symptomatology. He is mixed with his quality life due to his urinary symptoms.  His PVR is 39 mL.   His previous I PSS score was 13/4 and his previous PVR was 58 mL.  He states he did notice an improvement in his urinary frequency, urgency and urge incontinence with the Toviaz 4 mg daily.  He would like to continue the medication, but he states that it is cost prohibitive and  would like a more economical option.  He denies any dysuria, hematuria or suprapubic pain.   He also denies any recent fevers, chills, nausea or vomiting.  He does not have a family history of PCa.    Score:  1-7 Mild 8-19 Moderate 20-35 Severe  PMH: Past Medical History:  Diagnosis Date  . BPH without obstruction/lower urinary tract symptoms   . Erectile dysfunction   . Heart murmur   . History of skin cancer   . HLD (hyperlipidemia)   . Hypertension   . Hypogonadism in male   . Osteoarthritis   . Urinary frequency     Surgical History: Past Surgical History:  Procedure Laterality Date  . BASAL CELL CARCINOMA EXCISION     Head  . circumscision    . COLONOSCOPY WITH PROPOFOL N/A 12/10/2015   Procedure: COLONOSCOPY WITH PROPOFOL;  Surgeon: Lucilla Lame, MD;  Location: [facility];  Service: Endoscopy;  Laterality: N/A;  . spider bite Left arm   major reconstructive surgery for gangrene    Home Medications:  Allergies as of 11/02/2016   No Known Allergies     Medication List       Accurate as of 11/02/16  1:55 PM. Always use your most recent med list.          amLODipine 10 MG tablet Commonly known as:  NORVASC Take 1 tablet (  10 mg total) by mouth daily.   aspirin EC 81 MG tablet Take 81 mg by mouth daily.   BEE POLLEN PO Take 1 tablet by mouth daily.   cholecalciferol 1000 units tablet Commonly known as:  VITAMIN D Take 1,000 Units by mouth daily.   hydrochlorothiazide 25 MG tablet Commonly known as:  HYDRODIURIL Take 1 tablet (25 mg total) by mouth daily.   Hydrocodone-Acetaminophen 5-300 MG Tabs Take 1 tablet by mouth every 6 (six) hours as needed. for pain   ketoconazole 2 % cream Commonly known as:  NIZORAL APPLY ON THE SKIN TWICE A [place] (AFFECTED AREA ON CHEST)   losartan 50 MG tablet Commonly known as:  COZAAR Take 1 tablet (50 mg total) by mouth daily.   meloxicam 15 MG tablet Commonly known as:  MOBIC TAKE 1 TABLET BY MOUTH   DAILY   oxybutynin 10 MG 24 hr tablet Commonly known as:  DITROPAN-XL Take 1 tablet (10 mg total) by mouth at bedtime.   ranitidine 300 MG tablet Commonly known as:  ZANTAC TAKE 1 TABLET BY MOUTH AT  BEDTIME   selenium 50 MCG Tabs tablet Take 50 mcg by mouth daily.   sildenafil 50 MG tablet Commonly known as:  VIAGRA Take 1 tablet (50 mg total) by mouth daily as needed for erectile dysfunction.       Allergies: No Known Allergies  Family History: Family History  Problem Relation Age of Onset  . Hypertension Mother   . Cancer Father     lung  . Heart disease Father     MI  . Hypertension Brother   . Allergies Daughter   . Hypertension Son   . Thyroid disease Son   . Hypertension Maternal Grandmother   . Hypertension Maternal Grandfather   . Hypertension Brother   . Kidney disease Neg Hx   . Prostate cancer Neg Hx     Social History:  reports that he quit smoking about 21 years ago. His smoking use included Cigarettes. He has never used smokeless tobacco. He reports that he does not drink alcohol or use drugs.  ROS:      Physical Exam: There were no vitals taken for this visit.  Constitutional: Well nourished. Alert and oriented, No acute distress. HEENT: [place] AT, moist mucus membranes. Trachea midline, no masses. Cardiovascular: No clubbing, cyanosis, or edema. Respiratory: Normal respiratory effort, no increased work of breathing. GI: Abdomen is soft, non tender, non distended, no abdominal masses. Liver and spleen not palpable.  No hernias appreciated.  Stool sample for occult testing is not indicated.   GU: No CVA tenderness.  No bladder fullness or masses.  Patient with circumcised/uncircumcised phallus. ***Foreskin easily retracted***  Urethral meatus is patent.  No penile discharge. No penile lesions or rashes. Scrotum without lesions, cysts, rashes and/or edema.  Testicles are located scrotally bilaterally. No masses are appreciated in the testicles. Left  and right epididymis are normal. Rectal: Patient with  normal sphincter tone. Anus and perineum without scarring or rashes. No rectal masses are appreciated. Prostate is approximately *** grams, *** nodules are appreciated. Seminal vesicles are normal. Skin: No rashes, bruises or suspicious lesions. Lymph: No cervical or inguinal adenopathy. Neurologic: Grossly intact, no focal deficits, moving all 4 extremities. Psychiatric: Normal mood and affect.   Laboratory Data: Lab Results  Component Value Date   WBC 6.8 06/22/2016   HCT 35.3 (L) 06/22/2016   MCV 82 06/22/2016   PLT 171 06/22/2016   Lab Results  Component Value Date   CREATININE 1.10 05/09/2016   PSA History  1.1 ng/mL on 11/10/2015  1.1 ng/mL on 10/30/2016  Pertinent imaging ***   Assessment & Plan:   1. Erectile dysfunction  - SHIM score is ***, it is stable, worsening, improving  - I explained to the patient that in order to achieve an erection it takes good functioning of the nervous system (parasympathetic, sympathetic, sensory and motor), good blood flow into the erectile tissue of the penis and a desire to have sex  - I explained that conditions like diabetes, hypertension, coronary artery disease, peripheral vascular disease, smoking, alcohol consumption, age, sleep apnea and BPH can diminish the ability to have an erection  - We discussed trying a *** different PDE5 inhibitor, intra-urethral suppositories, intracavernous vasoactive drug injection therapy, vacuum constriction device and penile prosthesis implantation  - He would like to try ***  - Continue Cialis, Viagra, Levitra, Stendra  - RTC in *** months for repeat SHIM score and exam   2. BPH with LUTS  - IPSS score is ***, it is stable/improving/worsening  - Continue conservative management, avoiding bladder irritants and timed voiding's  - most bothersome symptoms are   - Initiate alpha-blocker (***), discussed side effects  - Initiate 5 alpha  reductase inhibitor (***), discussed side effects  - Continue tamsulosin 0.4 mg daily, alfuzosin 10 mg daily, Rapaflo 8 mg daily, terazosin, doxazosin, Cialis 5 mg daily and finasteride 5 mg daily, dutasteride 0.5 mg daily***:refills given  - Cannot tolerate medication or medication failure, schedule cystoscopy  - RTC in *** months for IPSS, PSA, PVR and exam   3. Urgency  - PVR's minimal  - continue oxybutynin XL 10 mg daily; refills given    No Follow-up on file.  Zara Council, [facility] [address] [place], [place] [address] [phone]

## 2016-11-11 ENCOUNTER — Encounter: Payer: 59 | Admitting: Unknown Physician Specialty

## 2016-11-14 ENCOUNTER — Encounter: Payer: Self-pay | Admitting: Unknown Physician Specialty

## 2016-11-21 ENCOUNTER — Ambulatory Visit: Payer: 59 | Admitting: Urology

## 2016-11-30 ENCOUNTER — Ambulatory Visit: Payer: 59 | Admitting: Urology

## 2016-11-30 ENCOUNTER — Encounter: Payer: Self-pay | Admitting: Urology

## 2016-11-30 NOTE — Progress Notes (Deleted)
8:44 AM   Richard Dean 07-09-1951 [id number]  Referring provider: Kathrine Haddock, NP [place], [place] [address]  No chief complaint on file.   HPI: Patient is a 66 year old Caucasian male with erectile dysfunction and BPH and LUTS who presents today for one year follow up.    Previous history Patient with BPH and LUTS who was on Flomax, but was having urinary frequency, urgency and urge incontinence. He was found to be drinking large amounts of diet soda. It was suggested that he remove diet sodas from his diet and return in 3 months for uroflow/PVR.  He did not complete a uroflow because he urinated prior to coming to the appointment and was found to have a low residual.  He did not completely eliminate his diet soda consumption, but he did reduce it drastically. He has seen an improvement in his urinary urgency, frequency and urge incontinence. He states he has a bad [place] once in a week's time.  He was changed to Rapaflo, but he did not find significant improvement and it was cost prohibitive.  Erectile dysfunction His SHIM score is ***, which is *** erectile dysfunction.  He has been having difficulty with erections for the last few years.   His major complaint is maintaining an erection.  His libido is preserved.   His risk factors for ED are HTN, age, hyperlipidemia and increased BMI.  He denies any painful erections or curvatures with his erections.   He has tried Viagra in the past with good results.  He has had no changes that his erectile dysfunction since his visit with [place] 1 month ago  BPH WITH LUTS His IPSS score today is ***, which is *** lower urinary tract symptomatology. He is *** with his quality life due to his urinary symptoms.  His PVR is *** mL.   His previous I PSS score was 12/3 and his previous PVR was 39 mL.  He states he did notice an improvement in his urinary frequency, urgency and urge incontinence with the Toviaz 4 mg daily.  He would like to continue the  medication, but he states that it is cost prohibitive and would like a more economical option.  He denies any dysuria, hematuria or suprapubic pain.   He also denies any recent fevers, chills, nausea or vomiting.  He does not have a family history of PCa.    Score:  1-7 Mild 8-19 Moderate 20-35 Severe  PMH: Past Medical History:  Diagnosis Date  . BPH without obstruction/lower urinary tract symptoms   . Erectile dysfunction   . Heart murmur   . History of skin cancer   . HLD (hyperlipidemia)   . Hypertension   . Hypogonadism in male   . Osteoarthritis   . Urinary frequency     Surgical History: Past Surgical History:  Procedure Laterality Date  . BASAL CELL CARCINOMA EXCISION     Head  . circumscision    . COLONOSCOPY WITH PROPOFOL N/A 12/10/2015   Procedure: COLONOSCOPY WITH PROPOFOL;  Surgeon: Lucilla Lame, MD;  Location: [facility];  Service: Endoscopy;  Laterality: N/A;  . spider bite Left arm   major reconstructive surgery for gangrene    Home Medications:  Allergies as of 11/30/2016   No Known Allergies     Medication List       Accurate as of 11/30/16  8:44 AM. Always use your most recent med list.          amLODipine  10 MG tablet Commonly known as:  NORVASC Take 1 tablet (10 mg total) by mouth daily.   aspirin EC 81 MG tablet Take 81 mg by mouth daily.   BEE POLLEN PO Take 1 tablet by mouth daily.   cholecalciferol 1000 units tablet Commonly known as:  VITAMIN D Take 1,000 Units by mouth daily.   hydrochlorothiazide 25 MG tablet Commonly known as:  HYDRODIURIL Take 1 tablet (25 mg total) by mouth daily.   Hydrocodone-Acetaminophen 5-300 MG Tabs Take 1 tablet by mouth every 6 (six) hours as needed. for pain   ketoconazole 2 % cream Commonly known as:  NIZORAL APPLY ON THE SKIN TWICE A [place] (AFFECTED AREA ON CHEST)   losartan 50 MG tablet Commonly known as:  COZAAR Take 1 tablet (50 mg total) by mouth daily.   meloxicam 15 MG  tablet Commonly known as:  MOBIC TAKE 1 TABLET BY MOUTH  DAILY   oxybutynin 10 MG 24 hr tablet Commonly known as:  DITROPAN-XL Take 1 tablet (10 mg total) by mouth at bedtime.   ranitidine 300 MG tablet Commonly known as:  ZANTAC TAKE 1 TABLET BY MOUTH AT  BEDTIME   selenium 50 MCG Tabs tablet Take 50 mcg by mouth daily.   sildenafil 50 MG tablet Commonly known as:  VIAGRA Take 1 tablet (50 mg total) by mouth daily as needed for erectile dysfunction.       Allergies: No Known Allergies  Family History: Family History  Problem Relation Age of Onset  . Hypertension Mother   . Cancer Father     lung  . Heart disease Father     MI  . Hypertension Brother   . Allergies Daughter   . Hypertension Son   . Thyroid disease Son   . Hypertension Maternal Grandmother   . Hypertension Maternal Grandfather   . Hypertension Brother   . Kidney disease Neg Hx   . Prostate cancer Neg Hx     Social History:  reports that he quit smoking about 21 years ago. His smoking use included Cigarettes. He has never used smokeless tobacco. He reports that he does not drink alcohol or use drugs.  ROS:      Physical Exam: There were no vitals taken for this visit.  Constitutional: Well nourished. Alert and oriented, No acute distress. HEENT: [place] AT, moist mucus membranes. Trachea midline, no masses. Cardiovascular: No clubbing, cyanosis, or edema. Respiratory: Normal respiratory effort, no increased work of breathing. GI: Abdomen is soft, non tender, non distended, no abdominal masses. Liver and spleen not palpable.  No hernias appreciated.  Stool sample for occult testing is not indicated.   GU: No CVA tenderness.  No bladder fullness or masses.  Patient with circumcised/uncircumcised phallus. ***Foreskin easily retracted***  Urethral meatus is patent.  No penile discharge. No penile lesions or rashes. Scrotum without lesions, cysts, rashes and/or edema.  Testicles are located scrotally  bilaterally. No masses are appreciated in the testicles. Left and right epididymis are normal. Rectal: Patient with  normal sphincter tone. Anus and perineum without scarring or rashes. No rectal masses are appreciated. Prostate is approximately *** grams, *** nodules are appreciated. Seminal vesicles are normal. Skin: No rashes, bruises or suspicious lesions. Lymph: No cervical or inguinal adenopathy. Neurologic: Grossly intact, no focal deficits, moving all 4 extremities. Psychiatric: Normal mood and affect.   Laboratory Data: Lab Results  Component Value Date   WBC 6.8 06/22/2016   HCT 35.3 (L) 06/22/2016   MCV 82  06/22/2016   PLT 171 06/22/2016   Lab Results  Component Value Date   CREATININE 1.10 05/09/2016   PSA History  1.1 ng/mL on 11/10/2015  1.1 ng/mL on 10/30/2016  Pertinent imaging ***   Assessment & Plan:   1. Erectile dysfunction  - SHIM score is ***, it is stable, worsening, improving  - I explained to the patient that in order to achieve an erection it takes good functioning of the nervous system (parasympathetic, sympathetic, sensory and motor), good blood flow into the erectile tissue of the penis and a desire to have sex  - I explained that conditions like diabetes, hypertension, coronary artery disease, peripheral vascular disease, smoking, alcohol consumption, age, sleep apnea and BPH can diminish the ability to have an erection  - We discussed trying a *** different PDE5 inhibitor, intra-urethral suppositories, intracavernous vasoactive drug injection therapy, vacuum constriction device and penile prosthesis implantation  - He would like to try ***  - Continue Cialis, Viagra, Levitra, Stendra  - RTC in *** months for repeat SHIM score and exam   2. BPH with LUTS  - IPSS score is ***, it is stable/improving/worsening  - Continue conservative management, avoiding bladder irritants and timed voiding's  - most bothersome symptoms are   - Initiate  alpha-blocker (***), discussed side effects  - Initiate 5 alpha reductase inhibitor (***), discussed side effects  - Continue tamsulosin 0.4 mg daily, alfuzosin 10 mg daily, Rapaflo 8 mg daily, terazosin, doxazosin, Cialis 5 mg daily and finasteride 5 mg daily, dutasteride 0.5 mg daily***:refills given  - Cannot tolerate medication or medication failure, schedule cystoscopy  - RTC in *** months for IPSS, PSA, PVR and exam   3. Urgency  - PVR's minimal  - continue oxybutynin XL 10 mg daily; refills given    No Follow-up on file.  Zara Council, [facility] [address] [place], [place] [address] [phone]

## 2016-12-05 ENCOUNTER — Other Ambulatory Visit: Payer: Self-pay | Admitting: Unknown Physician Specialty

## 2016-12-25 NOTE — Progress Notes (Signed)
3:47 PM   Richard Dean 05-13-1951 [id number]  Referring provider: Kathrine Haddock, NP [place], [place] [address]  Chief Complaint  Patient presents with  . Benign Prostatic Hypertrophy    10 month follow up   . Erectile Dysfunction    HPI: Patient is a 66 year old Caucasian male with erectile dysfunction and BPH and LUTS who presents today for one year follow up.    Previous history Patient with BPH and LUTS who was on Flomax, but was having urinary frequency, urgency and urge incontinence. He was found to be drinking large amounts of diet soda. It was suggested that he remove diet sodas from his diet and return in 3 months for uroflow/PVR.  He did not complete a uroflow because he urinated prior to coming to the appointment and was found to have a low residual.  He did not completely eliminate his diet soda consumption, but he did reduce it drastically. He has seen an improvement in his urinary urgency, frequency and urge incontinence. He states he has a bad Zollner once in a week's time.  He was changed to Rapaflo, but he did not find significant improvement and it was cost prohibitive.  Erectile dysfunction His SHIM score is 10, which is moderate erectile dysfunction.  He has been having difficulty with erections for the last few years.   His major complaint is maintaining an erection.  His libido is preserved.   His risk factors for ED are HTN, age, hyperlipidemia and increased BMI.  He denies any painful erections or curvatures with his erections.   He has tried Viagra in the past with good results.  He has had no changes that his erectile dysfunction since his visit with [place] 1 month ago      [place] Name 12/26/16 1520         SHIM: Over the last 6 months:   How do you rate your confidence that you could get and keep an erection? Low     When you had erections with sexual stimulation, how often were your erections hard enough for penetration (entering your partner)? A Few  Times (much less than half the time)     During sexual intercourse, how often were you able to maintain your erection after you had penetrated (entered) your partner? Almost Never or Never     During sexual intercourse, how difficult was it to maintain your erection to completion of intercourse? Difficult     When you attempted sexual intercourse, how often was it satisfactory for you? A Few Times (much less than half the time)       SHIM Total Score   SHIM 10      BPH WITH LUTS His IPSS score today is 8, which is mild lower urinary tract symptomatology. He is mostly satisfied with his quality life due to his urinary symptoms.  His previous I PSS score was 12/3 and his previous PVR was 39 mL.  He states he did notice an improvement in his urinary frequency, urgency and urge incontinence with the Ditropan XL 10 mg.  He denies any dysuria, hematuria or suprapubic pain.   He also denies any recent fevers, chills, nausea or vomiting.  He does not have a family history of PCa.      IPSS    Row Name 12/26/16 1500         International Prostate Symptom Score   How often have you had the sensation of  not emptying your bladder? Not at All     How often have you had to urinate less than every two hours? Less than half the time     How often have you found you stopped and started again several times when you urinated? Less than 1 in 5 times     How often have you found it difficult to postpone urination? Less than half the time     How often have you had a weak urinary stream? Not at All     How often have you had to strain to start urination? Not at All     How many times did you typically get up at night to urinate? 3 Times     Total IPSS Score 8       Quality of Life due to urinary symptoms   If you were to spend the rest of your life with your urinary condition just the way it is now how would you feel about that? Mostly Satisfied        Score:  1-7 Mild 8-19 Moderate 20-35  Severe  PMH: Past Medical History:  Diagnosis Date  . BPH without obstruction/lower urinary tract symptoms   . Erectile dysfunction   . Heart murmur   . History of skin cancer   . HLD (hyperlipidemia)   . Hypertension   . Hypogonadism in male   . Osteoarthritis   . Urinary frequency     Surgical History: Past Surgical History:  Procedure Laterality Date  . BASAL CELL CARCINOMA EXCISION     Head  . circumscision    . COLONOSCOPY WITH PROPOFOL N/A 12/10/2015   Procedure: COLONOSCOPY WITH PROPOFOL;  Surgeon: Lucilla Lame, MD;  Location: [facility];  Service: Endoscopy;  Laterality: N/A;  . spider bite Left arm   major reconstructive surgery for gangrene    Home Medications:  Allergies as of 12/26/2016   No Known Allergies     Medication List       Accurate as of 12/26/16  3:47 PM. Always use your most recent med list.          amLODipine 10 MG tablet Commonly known as:  NORVASC Take 1 tablet (10 mg total) by mouth daily.   aspirin EC 81 MG tablet Take 81 mg by mouth daily.   BEE POLLEN PO Take 1 tablet by mouth daily.   bumetanide 1 MG tablet Commonly known as:  BUMEX Take by mouth.   cholecalciferol 1000 units tablet Commonly known as:  VITAMIN D Take 1,000 Units by mouth daily.   hydrochlorothiazide 25 MG tablet Commonly known as:  HYDRODIURIL TAKE 1 TABLET BY MOUTH  DAILY   Hydrocodone-Acetaminophen 5-300 MG Tabs Take 1 tablet by mouth every 6 (six) hours as needed. for pain   Ivermectin 1 % Crea Apply topically once daily.   ketoconazole 2 % cream Commonly known as:  NIZORAL APPLY ON THE SKIN TWICE A [place] (AFFECTED AREA ON CHEST)   losartan 50 MG tablet Commonly known as:  COZAAR Take 1 tablet (50 mg total) by mouth daily.   meloxicam 15 MG tablet Commonly known as:  MOBIC TAKE 1 TABLET BY MOUTH  DAILY   meloxicam 15 MG tablet Commonly known as:  MOBIC Take by mouth.   oxybutynin 10 MG 24 hr tablet Commonly known as:   DITROPAN-XL Take 1 tablet (10 mg total) by mouth at bedtime.   pantoprazole 40 MG tablet Commonly known as:  PROTONIX Take by  mouth.   ranitidine 300 MG tablet Commonly known as:  ZANTAC TAKE 1 TABLET BY MOUTH AT  BEDTIME   selenium 50 MCG Tabs tablet Take 50 mcg by mouth daily.   sildenafil 100 MG tablet Commonly known as:  VIAGRA Take 1 tablet (100 mg total) by mouth daily as needed for erectile dysfunction. Take two hours prior to intercourse on an empty stomach       Allergies: No Known Allergies  Family History: Family History  Problem Relation Age of Onset  . Hypertension Mother   . Cancer Father     lung  . Heart disease Father     MI  . Hypertension Brother   . Allergies Daughter   . Hypertension Son   . Thyroid disease Son   . Hypertension Maternal Grandmother   . Hypertension Maternal Grandfather   . Hypertension Brother   . Kidney disease Neg Hx   . Prostate cancer Neg Hx     Social History:  reports that he quit smoking about 21 years ago. His smoking use included Cigarettes. He has never used smokeless tobacco. He reports that he does not drink alcohol or use drugs.  ROS: UROLOGY Frequent Urination?: Yes Hard to postpone urination?: No Burning/pain with urination?: No Get up at night to urinate?: Yes Leakage of urine?: Yes Urine stream starts and stops?: No Trouble starting stream?: No Do you have to strain to urinate?: No Blood in urine?: No Urinary tract infection?: No Sexually transmitted disease?: No Injury to kidneys or bladder?: No Painful intercourse?: No Weak stream?: No Erection problems?: No Penile pain?: No Gastrointestinal Nausea?: No Vomiting?: No Indigestion/heartburn?: No Diarrhea?: No Constipation?: No Constitutional Fever: No Night sweats?: No Weight loss?: No Fatigue?: No Skin Skin rash/lesions?: No Itching?: No Eyes Blurred vision?: No Double vision?: No Ears/Nose/Throat Sore throat?: No Sinus problems?:  No Hematologic/Lymphatic Swollen glands?: No Easy bruising?: No Cardiovascular Leg swelling?: Yes Chest pain?: No Respiratory Cough?: No Shortness of breath?: No Endocrine Excessive thirst?: No Musculoskeletal Back pain?: Yes Joint pain?: No Neurological Headaches?: No Dizziness?: No Psychologic Depression?: No Anxiety?: No   Physical Exam: BP (!) 162/93   Pulse 81   Ht 5' 9.5" (1.765 m)   Wt 222 lb 4.8 oz (100.8 kg)   BMI 32.36 kg/m   Constitutional: Well nourished. Alert and oriented, No acute distress. HEENT: [place] AT, moist mucus membranes. Trachea midline, no masses. Cardiovascular: No clubbing, cyanosis, or edema. Respiratory: Normal respiratory effort, no increased work of breathing. GI: Abdomen is soft, non tender, non distended, no abdominal masses. Liver and spleen not palpable.  No hernias appreciated.  Stool sample for occult testing is not indicated.   GU: No CVA tenderness.  No bladder fullness or masses.  Patient with circumcised phallus.  Urethral meatus is patent.  No penile discharge. No penile lesions or rashes. Scrotum without lesions, cysts, rashes and/or edema.  Testicles are located scrotally bilaterally. No masses are appreciated in the testicles. Left and right epididymis are normal. Rectal: Patient with  normal sphincter tone. Anus and perineum without scarring or rashes. No rectal masses are appreciated. Prostate is approximately 55 grams, no nodules are appreciated. Seminal vesicles are normal. Skin: No rashes, bruises or suspicious lesions. Lymph: No cervical or inguinal adenopathy. Neurologic: Grossly intact, no focal deficits, moving all 4 extremities. Psychiatric: Normal mood and affect.   Laboratory Data: Lab Results  Component Value Date   WBC 6.8 06/22/2016   HCT 35.3 (L) 06/22/2016   MCV 82  06/22/2016   PLT 171 06/22/2016   Lab Results  Component Value Date   CREATININE 1.10 05/09/2016   PSA History  1.1 ng/mL on  11/10/2015  1.1 ng/mL on 10/30/2016  Assessment & Plan:   1. Erectile dysfunction  - SHIM score is 10  - I explained to the patient that in order to achieve an erection it takes good functioning of the nervous system (parasympathetic, sympathetic, sensory and motor), good blood flow into the erectile tissue of the penis and a desire to have sex  - I explained that conditions like diabetes, hypertension, coronary artery disease, peripheral vascular disease, smoking, alcohol consumption, age, sleep apnea and BPH can diminish the ability to have an erection  - Continue Viagra; refill given  - RTC in 12 months for repeat SHIM score and exam   2. BPH with LUTS  - IPSS score is 8/2, it is improving  - Continue conservative management, avoiding bladder irritants and timed voiding's  - most bothersome symptoms are frequency, nocturia and incontinence.  - RTC in 12 months for IPSS, PSA and exam   3. Urge incontinence  - PVR's minimal  - continue oxybutynin XL 10 mg daily; refills given  Return in about 1 year (around 12/26/2017) for IPSS, PSA and exam.  Zara Council, [facility]  [place] [address], [place], [place] [address] [phone]

## 2016-12-26 ENCOUNTER — Ambulatory Visit (INDEPENDENT_AMBULATORY_CARE_PROVIDER_SITE_OTHER): Payer: 59 | Admitting: Urology

## 2016-12-26 ENCOUNTER — Encounter: Payer: Self-pay | Admitting: Urology

## 2016-12-26 VITALS — BP 162/93 | HR 81 | Ht 69.5 in | Wt 222.3 lb

## 2016-12-26 DIAGNOSIS — N401 Enlarged prostate with lower urinary tract symptoms: Secondary | ICD-10-CM | POA: Diagnosis not present

## 2016-12-26 DIAGNOSIS — N138 Other obstructive and reflux uropathy: Secondary | ICD-10-CM | POA: Diagnosis not present

## 2016-12-26 DIAGNOSIS — N3941 Urge incontinence: Secondary | ICD-10-CM

## 2016-12-26 DIAGNOSIS — N529 Male erectile dysfunction, unspecified: Secondary | ICD-10-CM

## 2016-12-26 MED ORDER — SILDENAFIL CITRATE 100 MG PO TABS: 100.0000 mg | ORAL_TABLET | Freq: Every day | ORAL | 12 refills | Status: DC | PRN

## 2016-12-26 MED ORDER — OXYBUTYNIN CHLORIDE ER 10 MG PO TB24: 10.0000 mg | ORAL_TABLET | Freq: Every day | ORAL | 3 refills | Status: DC

## 2017-01-09 ENCOUNTER — Telehealth: Payer: Self-pay | Admitting: Unknown Physician Specialty

## 2017-01-09 NOTE — Telephone Encounter (Signed)
Pt called and stated he needed his CPAP supplies but was unsure of where he should call.

## 2017-01-10 NOTE — Telephone Encounter (Signed)
Called and spoke with patient. He states that [facility] did not have his supplies and neither did [facility]. Sleep Study report is from a [employer]. Will try to find a phone number for them and give them a call.

## 2017-01-10 NOTE — Telephone Encounter (Signed)
Looked through patient's chart and I am not exactly sure where to call about patient's CPAP supplies. I called and left a VM at [facility] asking for a return phone call to see if they could help. Will call patient back as well.

## 2017-01-11 NOTE — Telephone Encounter (Signed)
Patient came by and gave [place] updated insurance information. Called Melissa and let her know that I was getting ready to fax this patient's information back to her. I asked Melissa if the patient should be expecting a call from them or what happens next. Melissa stated that they will call after processing order and that it could take between 7 to 10 days. Will call patient and let him know.

## 2017-01-11 NOTE — Telephone Encounter (Signed)
Called and let patient know that I have spoke with [facility] and they are sending [place] information to fill out and fax back regarding his CPAP supplies. I asked patient about insurance because we do not have an updated insurance on file for the patient. He stated that he would come by here to give [place] an updated copy on insurance information just incase it has changed. He stated he would come by around 2:30.

## 2017-01-11 NOTE — Telephone Encounter (Signed)
Melissa with [facility] returned my call but I missed the call and had to call her back. I explained to her that patient had an in home sleep study done and is needing CPAP supplies. Melissa states that she is going to send over the CMN to be signed by the provider and then we need to send it back to her with patient's insurance information, OV notes, and sleep study report. Will await her fax and then fax information back to her once it is signed.

## 2017-01-11 NOTE — Telephone Encounter (Signed)
Called and let patient know that he should hear something from [facility] in about 7 to 10 days regarding CPAP supplies.

## 2017-03-07 ENCOUNTER — Encounter: Payer: Self-pay | Admitting: Unknown Physician Specialty

## 2017-03-07 ENCOUNTER — Ambulatory Visit (INDEPENDENT_AMBULATORY_CARE_PROVIDER_SITE_OTHER): Payer: 59 | Admitting: Unknown Physician Specialty

## 2017-03-07 DIAGNOSIS — I10 Essential (primary) hypertension: Secondary | ICD-10-CM | POA: Diagnosis not present

## 2017-03-07 DIAGNOSIS — E7800 Pure hypercholesterolemia, unspecified: Secondary | ICD-10-CM | POA: Diagnosis not present

## 2017-03-07 DIAGNOSIS — G4733 Obstructive sleep apnea (adult) (pediatric): Secondary | ICD-10-CM

## 2017-03-07 NOTE — Assessment & Plan Note (Signed)
100% compliance with CPAP 

## 2017-03-07 NOTE — Assessment & Plan Note (Signed)
Stable, continue present medications.   

## 2017-03-07 NOTE — Progress Notes (Signed)
   BP 134/82 (BP Location: Left Arm, Cuff Size: Large)   Pulse 72   Temp 98.2 F (36.8 C)   Wt 234 lb 6.4 oz (106.3 kg)   SpO2 97%   BMI 34.12 kg/m    Subjective:    Patient ID: Richard Dean, male    DOB: [birth year], 66 y.o.   MRN: [id number]  HPI: Richard Dean is a 66 y.o. male  Chief Complaint  Patient presents with  . CPAP    pt needs usage and benefits documented for insurance purposes   Pt is using his CPAP at 100% compliance according to the report.  Feels he is sleeping through the night.  He states he feels more overall energy, especially in the afternoons.   Hypertension Using medications without difficulty Average home BPs not checking No problems or lightheadedness No chest pain with exertion or shortness of breath No Edema   Hyperlipidemia Using medications without problems: No Muscle aches    Relevant past medical, surgical, family and social history reviewed and updated as indicated. Interim medical history since our last visit reviewed. Allergies and medications reviewed and updated.  Review of Systems  Per HPI unless specifically indicated above     Objective:    BP 134/82 (BP Location: Left Arm, Cuff Size: Large)   Pulse 72   Temp 98.2 F (36.8 C)   Wt 234 lb 6.4 oz (106.3 kg)   SpO2 97%   BMI 34.12 kg/m   Wt Readings from Last 3 Encounters:  03/07/17 234 lb 6.4 oz (106.3 kg)  12/26/16 222 lb 4.8 oz (100.8 kg)  07/01/16 223 lb (101.2 kg)    Physical Exam  Constitutional: He is oriented to person, place, and time. He appears well-developed and well-nourished. No distress.  HENT:  Head: Normocephalic and atraumatic.  Eyes: Conjunctivae and lids are normal. Right eye exhibits no discharge. Left eye exhibits no discharge. No scleral icterus.  Neck: Normal range of motion. Neck supple. No JVD present. Carotid bruit is not present.  Cardiovascular: Normal rate, regular rhythm and normal heart sounds.   Pulmonary/Chest: Effort normal and breath  sounds normal. No respiratory distress.  Abdominal: Normal appearance. There is no splenomegaly or hepatomegaly.  Musculoskeletal: Normal range of motion.  Neurological: He is alert and oriented to person, place, and time.  Skin: Skin is warm, dry and intact. No rash noted. No pallor.  Psychiatric: He has a normal mood and affect. His behavior is normal. Judgment and thought content normal.    Results for orders placed or performed in visit on 10/24/16  PSA  Result Value Ref Range   Prostate Specific Ag, Serum 1.1 0.0 - 4.0 ng/mL      Assessment & Plan:   Problem List Items Addressed This Visit      Unprioritized   Essential hypertension    Stable, continue present medications.        Relevant Medications   metolazone (ZAROXOLYN) 2.5 MG tablet   Hyperlipidemia    Stable, continue present medications.        Relevant Medications   metolazone (ZAROXOLYN) 2.5 MG tablet   Sleep apnea    100% compliance with CPAP          Follow up plan: Return in about 3 months (around 06/07/2017).

## 2017-11-20 ENCOUNTER — Other Ambulatory Visit: Payer: Self-pay | Admitting: Urology

## 2017-12-18 ENCOUNTER — Other Ambulatory Visit: Payer: 59

## 2017-12-18 ENCOUNTER — Other Ambulatory Visit: Payer: Self-pay | Admitting: Family Medicine

## 2017-12-18 DIAGNOSIS — N138 Other obstructive and reflux uropathy: Secondary | ICD-10-CM

## 2017-12-18 DIAGNOSIS — N401 Enlarged prostate with lower urinary tract symptoms: Principal | ICD-10-CM

## 2017-12-20 ENCOUNTER — Other Ambulatory Visit: Payer: 59

## 2017-12-27 ENCOUNTER — Ambulatory Visit: Payer: 59 | Admitting: Urology

## 2018-09-27 ENCOUNTER — Other Ambulatory Visit: Payer: Self-pay | Admitting: Urology

## 2018-10-02 ENCOUNTER — Other Ambulatory Visit: Payer: Self-pay | Admitting: Urology

## 2018-10-13 ENCOUNTER — Other Ambulatory Visit: Payer: Self-pay | Admitting: Urology

## 2019-03-07 ENCOUNTER — Other Ambulatory Visit: Payer: Self-pay

## 2019-03-07 ENCOUNTER — Encounter
Admission: RE | Admit: 2019-03-07 | Discharge: 2019-03-07 | Disposition: A | Discharge status: Home / Self Care | Payer: Managed Care, Other (non HMO) | Source: Ambulatory Visit | Attending: Surgery | Admitting: Surgery

## 2019-03-07 DIAGNOSIS — Z01818 Encounter for other preprocedural examination: Secondary | ICD-10-CM | POA: Diagnosis present

## 2019-03-07 HISTORY — DX: Gastro-esophageal reflux disease without esophagitis: K21.9

## 2019-03-07 LAB — CBC
HCT: 38.1 % — ABNORMAL LOW (ref 39.0–52.0)
Hemoglobin: 12.2 g/dL — ABNORMAL LOW (ref 13.0–17.0)
MCH: 26.6 pg (ref 26.0–34.0)
MCHC: 32 g/dL (ref 30.0–36.0)
MCV: 83 fL (ref 80.0–100.0)
Platelets: 153 10*3/uL (ref 150–400)
RBC: 4.59 MIL/uL (ref 4.22–5.81)
RDW: 15.5 % (ref 11.5–15.5)
WBC: 5.4 10*3/uL (ref 4.0–10.5)
nRBC: 0 % (ref 0.0–0.2)

## 2019-03-07 LAB — BASIC METABOLIC PANEL
Anion gap: 7 (ref 5–15)
BUN: 20 mg/dL (ref 8–23)
CO2: 28 mmol/L (ref 22–32)
Calcium: 9 mg/dL (ref 8.9–10.3)
Chloride: 106 mmol/L (ref 98–111)
Creatinine, Ser: 1.1 mg/dL (ref 0.61–1.24)
GFR calc Af Amer: >60 mL/min (ref >60)
GFR calc non Af Amer: >60 mL/min (ref >60)
Glucose, Bld: 85 mg/dL (ref 70–99)
Potassium: 5 mmol/L (ref 3.5–5.1)
Sodium: 141 mmol/L (ref 135–145)

## 2019-03-07 NOTE — Patient Instructions (Signed)
Your procedure is scheduled on: Thursday 03/14/19 Report to [place]. To find out your arrival time please call [phone] between 1PM - 3PM on Wednesday 03/13/19.  Remember: Instructions that are not followed completely may result in serious medical risk, up to and including death, or upon the discretion of your surgeon and anesthesiologist your surgery may need to be rescheduled.     _X__ 1. Do not eat food after midnight the night before your procedure.                 No gum chewing or hard candies. You may drink clear liquids up to 2 hours                 before you are scheduled to arrive for your surgery- DO not drink clear                 liquids within 2 hours of the start of your surgery.                 Clear Liquids include:  water, apple juice without pulp, clear carbohydrate                 drink such as Clearfast or Gatorade, Black Coffee or Tea (Do not add                 anything to coffee or tea). Diabetics water only  __X__2.  On the morning of surgery brush your teeth with toothpaste and water, you                 may rinse your mouth with mouthwash if you wish.  Do not swallow any              toothpaste of mouthwash.     _X__ 3.  No Alcohol for 24 hours before or after surgery.   _X__ 4.  Do Not Smoke or use e-cigarettes For 24 Hours Prior to Your Surgery.                 Do not use any chewable tobacco products for at least 6 hours prior to                 surgery.  ____  5.  Bring all medications with you on the [place] of surgery if instructed.   __X__  6.  Notify your doctor if there is any change in your medical condition      (cold, fever, infections).     Do not wear jewelry, make-up, hairpins, clips or nail polish. Do not wear lotions, powders, or perfumes.  Do not shave 48 hours prior to surgery. Men may shave face and neck. Do not bring valuables to the hospital.    [facility] is not responsible for  any belongings or valuables.  Contacts, dentures/partials or body piercings may not be worn into surgery. Bring a case for your contacts, glasses or hearing aids, a denture cup will be supplied. Leave your suitcase in the car. After surgery it may be brought to your room. For patients admitted to the hospital, discharge time is determined by your treatment team.   Patients discharged the [place] of surgery will not be allowed to drive home.   Please read over the following fact sheets that you were given:   MRSA Information  __X__ Take these medicines the morning of surgery with A SIP OF WATER:  1. DULoxetine (CYMBALTA)   2. pantoprazole (PROTONIX)   3.   4.  5.  6.  ____ Fleet Enema (as directed)   __X__ Use CHG Soap/SAGE wipes as directed  ____ Use inhalers on the [place] of surgery  ____ Stop metformin/Janumet/Farxiga 2 days prior to surgery    ____ Take 1/2 of usual insulin dose the night before surgery. No insulin the morning          of surgery.   ____ Stop Blood Thinners Coumadin/Plavix/Xarelto/Pleta/Pradaxa/Eliquis/Effient/Aspirin  on   Or contact your Surgeon, Cardiologist or Medical Doctor regarding  ability to stop your blood thinners  __X__ Stop Anti-inflammatories 7 days before surgery such as Advil, Ibuprofen, Motrin,  BC or Goodies Powder, Naprosyn, Naproxen, Aleve, Aspirin and Lodine today   __X__ Stop all herbal supplements, fish oil or vitamin E until after surgery. Stop turmeric and bee pollen today   __X__ Bring C-Pap to the hospital.

## 2019-03-11 ENCOUNTER — Other Ambulatory Visit
Admission: RE | Admit: 2019-03-11 | Discharge: 2019-03-11 | Disposition: A | Discharge status: Home / Self Care | Payer: Managed Care, Other (non HMO) | Source: Ambulatory Visit | Attending: Surgery | Admitting: Surgery

## 2019-03-11 ENCOUNTER — Other Ambulatory Visit: Payer: Self-pay

## 2019-03-11 DIAGNOSIS — Z1159 Encounter for screening for other viral diseases: Secondary | ICD-10-CM | POA: Insufficient documentation

## 2019-03-11 DIAGNOSIS — Z01812 Encounter for preprocedural laboratory examination: Secondary | ICD-10-CM | POA: Insufficient documentation

## 2019-03-12 LAB — NOVEL CORONAVIRUS, NAA (HOSP ORDER, SEND-OUT TO REF LAB; TAT 18-24 HRS): SARS-CoV-2, NAA: NOT DETECTED

## 2019-03-13 MED ORDER — CEFAZOLIN SODIUM-DEXTROSE 2-4 GM/100ML-% IV SOLN
2.0000 g | Freq: Once | INTRAVENOUS | Status: AC
  Administered 2019-03-14: 2 g via INTRAVENOUS

## 2019-03-14 ENCOUNTER — Ambulatory Visit: Payer: Managed Care, Other (non HMO) | Admitting: Anesthesiology

## 2019-03-14 ENCOUNTER — Ambulatory Visit
Admission: RE | Admit: 2019-03-14 | Discharge: 2019-03-14 | Disposition: A | Discharge status: Home / Self Care | Payer: Managed Care, Other (non HMO) | Source: Ambulatory Visit | Attending: Surgery | Admitting: Surgery

## 2019-03-14 ENCOUNTER — Other Ambulatory Visit: Payer: Self-pay

## 2019-03-14 ENCOUNTER — Encounter
Admission: RE | Disposition: A | Discharge status: Home / Self Care | Payer: Self-pay | Source: Ambulatory Visit | Attending: Surgery

## 2019-03-14 DIAGNOSIS — Z87891 Personal history of nicotine dependence: Secondary | ICD-10-CM | POA: Diagnosis not present

## 2019-03-14 DIAGNOSIS — E669 Obesity, unspecified: Secondary | ICD-10-CM | POA: Diagnosis not present

## 2019-03-14 DIAGNOSIS — G4733 Obstructive sleep apnea (adult) (pediatric): Secondary | ICD-10-CM | POA: Insufficient documentation

## 2019-03-14 DIAGNOSIS — Z79899 Other long term (current) drug therapy: Secondary | ICD-10-CM | POA: Diagnosis not present

## 2019-03-14 DIAGNOSIS — K219 Gastro-esophageal reflux disease without esophagitis: Secondary | ICD-10-CM | POA: Insufficient documentation

## 2019-03-14 DIAGNOSIS — M1990 Unspecified osteoarthritis, unspecified site: Secondary | ICD-10-CM | POA: Insufficient documentation

## 2019-03-14 DIAGNOSIS — Z7982 Long term (current) use of aspirin: Secondary | ICD-10-CM | POA: Insufficient documentation

## 2019-03-14 DIAGNOSIS — Z6834 Body mass index (BMI) 34.0-34.9, adult: Secondary | ICD-10-CM | POA: Diagnosis not present

## 2019-03-14 DIAGNOSIS — I10 Essential (primary) hypertension: Secondary | ICD-10-CM | POA: Diagnosis not present

## 2019-03-14 DIAGNOSIS — G5601 Carpal tunnel syndrome, right upper limb: Secondary | ICD-10-CM | POA: Insufficient documentation

## 2019-03-14 HISTORY — PX: CARPAL TUNNEL RELEASE: SHX101

## 2019-03-14 SURGERY — RELEASE, CARPAL TUNNEL, ENDOSCOPIC
Anesthesia: General | Laterality: Right

## 2019-03-14 MED ORDER — HYDROCODONE-ACETAMINOPHEN 5-325 MG PO TABS: 1.0000 | ORAL_TABLET | Freq: Four times a day (QID) | ORAL | 0 refills | Status: DC | PRN

## 2019-03-14 MED ORDER — MIDAZOLAM HCL 5 MG/5ML IJ SOLN
INTRAMUSCULAR | Status: DC | PRN
  Administered 2019-03-14: 2 mg via INTRAVENOUS

## 2019-03-14 MED ORDER — FENTANYL CITRATE (PF) 100 MCG/2ML IJ SOLN
INTRAMUSCULAR | Status: DC | PRN
  Administered 2019-03-14 (×4): 25 ug via INTRAVENOUS

## 2019-03-14 MED ORDER — ONDANSETRON HCL 4 MG/2ML IJ SOLN: 4.0000 mg | Freq: Once | INTRAMUSCULAR | Status: DC | PRN

## 2019-03-14 MED ORDER — DEXAMETHASONE SODIUM PHOSPHATE 10 MG/ML IJ SOLN
INTRAMUSCULAR | Status: DC | PRN
  Administered 2019-03-14: 5 mg via INTRAVENOUS

## 2019-03-14 MED ORDER — PROPOFOL 10 MG/ML IV BOLUS
INTRAVENOUS | Status: AC
  Filled 2019-03-14: qty 20

## 2019-03-14 MED ORDER — ONDANSETRON HCL 4 MG/2ML IJ SOLN
INTRAMUSCULAR | Status: DC | PRN
  Administered 2019-03-14: 4 mg via INTRAVENOUS

## 2019-03-14 MED ORDER — CEFAZOLIN SODIUM-DEXTROSE 2-4 GM/100ML-% IV SOLN
INTRAVENOUS | Status: AC
  Filled 2019-03-14: qty 100

## 2019-03-14 MED ORDER — FENTANYL CITRATE (PF) 100 MCG/2ML IJ SOLN
INTRAMUSCULAR | Status: AC
  Filled 2019-03-14: qty 2

## 2019-03-14 MED ORDER — DEXAMETHASONE SODIUM PHOSPHATE 10 MG/ML IJ SOLN
INTRAMUSCULAR | Status: AC
  Filled 2019-03-14: qty 1

## 2019-03-14 MED ORDER — BUPIVACAINE HCL (PF) 0.5 % IJ SOLN
INTRAMUSCULAR | Status: DC | PRN
  Administered 2019-03-14: 10 mL

## 2019-03-14 MED ORDER — MIDAZOLAM HCL 2 MG/2ML IJ SOLN
INTRAMUSCULAR | Status: AC
  Filled 2019-03-14: qty 2

## 2019-03-14 MED ORDER — PROPOFOL 10 MG/ML IV BOLUS
INTRAVENOUS | Status: DC | PRN
  Administered 2019-03-14: 180 mg via INTRAVENOUS

## 2019-03-14 MED ORDER — LIDOCAINE HCL (PF) 2 % IJ SOLN
INTRAMUSCULAR | Status: AC
  Filled 2019-03-14: qty 10

## 2019-03-14 MED ORDER — FENTANYL CITRATE (PF) 100 MCG/2ML IJ SOLN: 25.0000 ug | INTRAMUSCULAR | Status: DC | PRN

## 2019-03-14 MED ORDER — LIDOCAINE HCL (CARDIAC) PF 100 MG/5ML IV SOSY
PREFILLED_SYRINGE | INTRAVENOUS | Status: DC | PRN
  Administered 2019-03-14: 60 mg via INTRAVENOUS

## 2019-03-14 MED ORDER — ONDANSETRON HCL 4 MG/2ML IJ SOLN
INTRAMUSCULAR | Status: AC
  Filled 2019-03-14: qty 2

## 2019-03-14 MED ORDER — BUPIVACAINE HCL (PF) 0.5 % IJ SOLN
INTRAMUSCULAR | Status: AC
  Filled 2019-03-14: qty 30

## 2019-03-14 MED ORDER — LACTATED RINGERS IV SOLN
INTRAVENOUS | Status: DC
  Administered 2019-03-14: 14:00:00 via INTRAVENOUS

## 2019-03-14 SURGICAL SUPPLY — 29 items
BNDG COHESIVE 4X5 TAN STRL (GAUZE/BANDAGES/DRESSINGS) ×2 IMPLANT
BNDG ELASTIC 2X5.8 VLCR STR LF (GAUZE/BANDAGES/DRESSINGS) ×2 IMPLANT
BNDG ESMARK 4X12 TAN STRL LF (GAUZE/BANDAGES/DRESSINGS) ×2 IMPLANT
CANISTER SUCT 1200ML W/VALVE (MISCELLANEOUS) ×2 IMPLANT
CHLORAPREP W/TINT 26 (MISCELLANEOUS) ×2 IMPLANT
CORD BIP STRL DISP 12FT (MISCELLANEOUS) ×2 IMPLANT
COVER WAND RF STERILE (DRAPES) ×2 IMPLANT
CUFF TOURN 18 STER (MISCELLANEOUS) ×1 IMPLANT
DRAPE SURG 17X11 SM STRL (DRAPES) ×2 IMPLANT
FORCEPS JEWEL BIP 4-3/4 STR (INSTRUMENTS) ×2 IMPLANT
GAUZE SPONGE 4X4 12PLY STRL (GAUZE/BANDAGES/DRESSINGS) ×2 IMPLANT
GAUZE XEROFORM 1X8 LF (GAUZE/BANDAGES/DRESSINGS) ×2 IMPLANT
GLOVE BIO SURGEON STRL SZ8 (GLOVE) ×2 IMPLANT
GLOVE INDICATOR 8.0 STRL GRN (GLOVE) ×2 IMPLANT
GOWN STRL REUS W/ TWL LRG LVL3 (GOWN DISPOSABLE) ×1 IMPLANT
GOWN STRL REUS W/ TWL XL LVL3 (GOWN DISPOSABLE) ×1 IMPLANT
GOWN STRL REUS W/TWL LRG LVL3 (GOWN DISPOSABLE) ×1
GOWN STRL REUS W/TWL XL LVL3 (GOWN DISPOSABLE) ×1
KIT CARPAL TUNNEL (MISCELLANEOUS) ×1
KIT ESCP INSRT D SLOT CANN KN (MISCELLANEOUS) ×1 IMPLANT
KIT TURNOVER KIT A (KITS) ×2 IMPLANT
NS IRRIG 500ML POUR BTL (IV SOLUTION) ×2 IMPLANT
PACK EXTREMITY ARMC (MISCELLANEOUS) ×2 IMPLANT
SPLINT WRIST LG LT TX990309 (SOFTGOODS) IMPLANT
SPLINT WRIST LG RT TX900304 (SOFTGOODS) ×1 IMPLANT
SPLINT WRIST M LT TX990308 (SOFTGOODS) IMPLANT
SPLINT WRIST M RT TX990303 (SOFTGOODS) IMPLANT
STOCKINETTE IMPERVIOUS 9X36 MD (GAUZE/BANDAGES/DRESSINGS) ×2 IMPLANT
SUT PROLENE 4 0 PS 2 18 (SUTURE) ×2 IMPLANT

## 2019-03-14 NOTE — Discharge Instructions (Addendum)
Orthopedic discharge instructions: Keep dressing dry and intact. Keep hand elevated above heart level. May shower after dressing removed on postop Leis 4 (Monday). Cover sutures with Band-Aids after drying off. Apply ice to affected area frequently. Take Lodine 400 mg daily OR Aleve 2 tablets BID with meals for 7-10 days, then as necessary. Take ES Tylenol or pain medication as prescribed when needed.  Return for follow-up in 10-14 days or as scheduled.  AMBULATORY SURGERY  DISCHARGE INSTRUCTIONS   1) The drugs that you were given will stay in your system until tomorrow so for the next 24 hours you should not:  A) Drive an automobile B) Make any legal decisions C) Drink any alcoholic beverage   2) You may resume regular meals tomorrow.  Today it is better to start with liquids and gradually work up to solid foods.  You may eat anything you prefer, but it is better to start with liquids, then soup and crackers, and gradually work up to solid foods.   3) Please notify your doctor immediately if you have any unusual bleeding, trouble breathing, redness and pain at the surgery site, drainage, fever, or pain not relieved by medication.    4) Additional Instructions:        Please contact your physician with any problems or [facility] at [phone], Monday through Friday 6 am to 4 pm, or [place] at [facility] number at [phone].

## 2019-03-14 NOTE — Transfer of Care (Signed)
Immediate Anesthesia Transfer of Care Note  Patient: Richard Dean  Procedure(s) Performed: CARPAL TUNNEL RELEASE ENDOSCOPIC RIGHT (Right )  Patient Location: PACU  Anesthesia Type:General  Level of Consciousness: sedated  Airway & Oxygen Therapy: Patient Spontanous Breathing and Patient connected to face mask oxygen  Post-op Assessment: Report given to RN and Post -op Vital signs reviewed and stable  Post vital signs: Reviewed and stable  Last Vitals:  Vitals Value Taken Time  BP 155/86 03/14/19 1657  Temp 36.3 C 03/14/19 1657  Pulse 75 03/14/19 1658  Resp 12 03/14/19 1658  SpO2 100 % 03/14/19 1658  Vitals shown include unvalidated device data.  Last Pain:  Vitals:   03/14/19 1357  TempSrc: Temporal  PainSc: 0-No pain         Complications: No apparent anesthesia complications

## 2019-03-14 NOTE — Anesthesia Post-op Follow-up Note (Signed)
Anesthesia QCDR form completed.        

## 2019-03-14 NOTE — Anesthesia Preprocedure Evaluation (Addendum)
Anesthesia Evaluation  Patient identified by MRN, date of birth, ID band Patient awake    Reviewed: Allergy & Precautions, NPO status , Patient's Chart, lab work & pertinent test results, reviewed documented beta blocker date and time   Airway Mallampati: III  TM Distance: >3 FB     Dental  (+) Chipped, Partial Upper, Partial Lower   Pulmonary sleep apnea and Continuous Positive Airway Pressure Ventilation , former smoker,           Cardiovascular hypertension, Pt. on medications      Neuro/Psych    GI/Hepatic GERD  Controlled,  Endo/Other    Renal/GU      Musculoskeletal   Abdominal   Peds  Hematology  (+) anemia ,   Anesthesia Other Findings Obese. Hx of PVCs.  Reproductive/Obstetrics                            Anesthesia Physical Anesthesia Plan  ASA: III  Anesthesia Plan: General   Post-op Pain Management:    Induction: Intravenous  PONV Risk Score and Plan:   Airway Management Planned: LMA  Additional Equipment:   Intra-op Plan:   Post-operative Plan:   Informed Consent: I have reviewed the patients History and Physical, chart, labs and discussed the procedure including the risks, benefits and alternatives for the proposed anesthesia with the patient or authorized representative who has indicated his/her understanding and acceptance.       Plan Discussed with: CRNA  Anesthesia Plan Comments:         Anesthesia Quick Evaluation

## 2019-03-14 NOTE — Anesthesia Procedure Notes (Signed)
Procedure Name: LMA Insertion Date/Time: 03/14/2019 4:18 PM Performed by: Dionne Bucy, CRNA Pre-anesthesia Checklist: Patient identified, Patient being monitored, Timeout performed, Emergency Drugs available and Suction available Patient Re-evaluated:Patient Re-evaluated prior to induction Oxygen Delivery Method: Circle system utilized Preoxygenation: Pre-oxygenation with 100% oxygen Induction Type: IV induction Ventilation: Mask ventilation without difficulty LMA: LMA inserted LMA Size: 4.0 Tube type: Oral Number of attempts: 1 Placement Confirmation: positive ETCO2 and breath sounds checked- equal and bilateral Tube secured with: Tape Dental Injury: Teeth and Oropharynx as per pre-operative assessment

## 2019-03-14 NOTE — Anesthesia Postprocedure Evaluation (Signed)
Anesthesia Post Note  Patient: Richard Dean  Procedure(s) Performed: CARPAL TUNNEL RELEASE ENDOSCOPIC RIGHT (Right )  Patient location during evaluation: PACU Anesthesia Type: General Level of consciousness: awake and alert Pain management: pain level controlled Vital Signs Assessment: post-procedure vital signs reviewed and stable Respiratory status: spontaneous breathing and respiratory function stable Cardiovascular status: stable Anesthetic complications: no     Last Vitals:  Vitals:   03/14/19 1727 03/14/19 1739  BP: 140/82 (!) 152/71  Pulse: 77 76  Resp: 20 16  Temp: 36.4 C 36.4 C  SpO2: 94% 93%    Last Pain:  Vitals:   03/14/19 1739  TempSrc: Temporal  PainSc: 0-No pain                 Alexandria Current K

## 2019-03-14 NOTE — Op Note (Signed)
03/14/2019  4:55 PM  Patient:   Richard Dean  Pre-Op Diagnosis:   Right carpal tunnel syndrome.  Post-Op Diagnosis:   Same.  Procedure:   Endoscopic right carpal tunnel release.  Surgeon:   Pascal Lux, MD  Anesthesia:   Bier block  Findings:   As above.  Complications:   None  EBL:   0 cc  Fluids:   500 cc crystalloid  TT:   13 minutes at 250 mmHg  Drains:   None  Closure:   4-0 Prolene interrupted sutures  Brief Clinical Note:   The patient is a 68 year old male with a history of progressively worsening pain and paresthesias to his right hand and wrist. His symptoms have progressed despite medications, activity modification, night splinting, injections, etc. His history and examination are consistent with carpal tunnel syndrome. The patient presents at this time for an endoscopic right carpal tunnel release.   Procedure:   The patient was brought into the operating room and lain in the supine position. After adequate IV sedation was achieved, a timeout was performed to verify the appropriate surgical site before a Bier block was placed by the anesthesiologist and the tourniquet inflated to 250 mmHg. The right hand and upper extremity were prepped with ChloraPrep solution before being draped sterilely. Preoperative antibiotics were administered. An approximately 1.5-2 cm incision was made over the volar wrist flexion crease, centered over the palmaris longus tendon. The incision was carried down through the subcutaneous tissues with care taken to identify and protect any neurovascular structures. The distal forearm fascia was penetrated just proximal to the transverse carpal ligament. The soft tissues were released off the superficial and deep surfaces of the distal forearm fascia and this was released proximally for 3-4 cm under direct visualization.  Attention was directed distally. The Soil scientist was passed beneath the transverse carpal ligament along the ulnar aspect of  the carpal tunnel and used to release any adhesions as well as to remove any adherent synovial tissue before first the smaller then the larger of the two dilators were passed beneath the transverse carpal ligament along the ulnar margin of the carpal tunnel. The slotted cannula was introduced and the endoscope was placed into the slotted cannula and the undersurface of the transverse carpal ligament visualized. The distal margin of the transverse carpal ligament was marked by placing a 25-gauge needle percutaneously at [place] cardinal point so that it entered the distal portion of the slotted cannula. Under endoscopic visualization, the transverse carpal ligament was released from proximal to distal using the end-cutting blade. A second pass was performed to ensure complete release of the ligament. The adequacy of release was verified both endoscopically and by palpation using the freer elevator.  The wound was irrigated thoroughly with sterile saline solution before being closed using 4-0 Prolene interrupted sutures. A total of 10 cc of 0.5% plain Sensorcaine was injected in and around the incision before a sterile bulky dressing was applied to the wound. The patient was placed into a volar wrist splint before being awakened and returned to the recovery room in satisfactory condition after tolerating the procedure well.

## 2019-03-14 NOTE — H&P (Signed)
Paper H&P to be scanned into permanent record. H&P reviewed and patient re-examined. No changes. 

## 2019-03-15 ENCOUNTER — Encounter: Payer: Self-pay | Admitting: Surgery

## 2019-06-10 ENCOUNTER — Other Ambulatory Visit: Payer: Self-pay

## 2019-06-10 DIAGNOSIS — Z20822 Contact with and (suspected) exposure to covid-19: Secondary | ICD-10-CM

## 2019-06-11 LAB — NOVEL CORONAVIRUS, NAA: SARS-CoV-2, NAA: NOT DETECTED

## 2020-02-03 ENCOUNTER — Other Ambulatory Visit: Payer: Self-pay | Admitting: Surgery

## 2020-02-13 ENCOUNTER — Other Ambulatory Visit: Payer: Self-pay

## 2020-02-13 ENCOUNTER — Encounter
Admission: RE | Admit: 2020-02-13 | Discharge: 2020-02-13 | Disposition: A | Discharge status: Home / Self Care | Payer: Managed Care, Other (non HMO) | Source: Ambulatory Visit | Attending: Surgery | Admitting: Surgery

## 2020-02-13 DIAGNOSIS — Z01818 Encounter for other preprocedural examination: Secondary | ICD-10-CM | POA: Insufficient documentation

## 2020-02-13 HISTORY — DX: Sleep apnea, unspecified: G47.30

## 2020-02-13 NOTE — Patient Instructions (Signed)
Your procedure is scheduled on: Thurs 6/17 Report to [facility]. To find out your arrival time please call [phone] between 1PM - 3PM on Wed. 6/16.  Remember: Instructions that are not followed completely may result in serious medical risk,  up to and including death, or upon the discretion of your surgeon and anesthesiologist your  surgery may need to be rescheduled.     _X__ 1. Do not eat food after midnight the night before your procedure.                 No gum chewing or hard candies. You may drink clear liquids up to 2 hours                 before you are scheduled to arrive for your surgery- DO not drink clear                 liquids within 2 hours of the start of your surgery.                 Clear Liquids include:  water, apple juice without pulp, clear Gatorade, G2 or                  Gatorade Zero (avoid Red/Purple/Blue), Black Coffee or Tea (Do not add                 anything to coffee or tea). __x___2.   Complete the carbohydrate drink provided to you, 2 hours before arrival.  __X__2.  On the morning of surgery brush your teeth with toothpaste and water, you                may rinse your mouth with mouthwash if you wish.  Do not swallow any toothpaste of mouthwash.     __ 3.  No Alcohol for 24 hours before or after surgery.   ___ 4.  Do Not Smoke or use e-cigarettes For 24 Hours Prior to Your Surgery.                 Do not use any chewable tobacco products for at least 6 hours prior to                 Surgery.  ___  5.  Do not use any recreational drugs (marijuana, cocaine, heroin, ecstacy, MDMA or other)                For at least one week prior to your surgery.  Combination of these drugs with anesthesia                May have life threatening results.  ____  6.  Bring all medications with you on the [place] of surgery if instructed.   __x__  7.  Notify your doctor if there is any change in your medical condition      (cold, fever,  infections).     Do not wear jewelry. Do not wear lotions, powders, or perfumes. You may wear deodorant. Do not shave 48 hours prior to surgery. Men may shave face and neck. Do not bring valuables to the hospital.    [facility] is not responsible for any belongings or valuables.  Contacts, dentures or bridgework may not be worn into surgery. Leave your suitcase in the car. After surgery it may be brought to your room. For patients admitted to the hospital, discharge time is determined by your treatment team.   Patients discharged the  [place] of surgery will not be allowed to drive home.   Make arrangements for someone to be with you for the first 24 hours of your [facility] Discharge.    Please read over the following fact sheets that you were given:    _x___ Take these medicines the morning of surgery with A SIP OF WATER:    1. pantoprazole (PROTONIX) 40 MG tablet  Take a dose the night before and the morning of surgery  2.   3.   4.  5.  6.  ____ Fleet Enema (as directed)   __x__ Use CHG Soap (or wipes) as directed  ____ Use Benzoyl Peroxide Gel as instructed  ____ Use inhalers on the [place] of surgery  ____ Stop metformin 2 days prior to surgery    ____ Take 1/2 of usual insulin dose the night before surgery. No insulin the morning          of surgery.   ____ Stop Coumadin/Plavix/aspirin  __x__ Stop Anti-inflammatories ibuprofen aleve or aspirin after 6/10    May take tylenol   __x__ Stop supplements until after surgery.  BEE POLLEN PO   On 6/10  __x__ Bring C-Pap to the hospital.

## 2020-02-19 ENCOUNTER — Encounter
Admission: RE | Admit: 2020-02-19 | Discharge: 2020-02-19 | Disposition: A | Discharge status: Home / Self Care | Payer: Managed Care, Other (non HMO) | Source: Ambulatory Visit | Attending: Surgery | Admitting: Surgery

## 2020-02-19 ENCOUNTER — Other Ambulatory Visit: Payer: Self-pay

## 2020-02-19 DIAGNOSIS — Z01810 Encounter for preprocedural cardiovascular examination: Secondary | ICD-10-CM | POA: Diagnosis not present

## 2020-02-19 DIAGNOSIS — Z01818 Encounter for other preprocedural examination: Secondary | ICD-10-CM | POA: Insufficient documentation

## 2020-02-19 LAB — URINALYSIS, ROUTINE W REFLEX MICROSCOPIC
Bilirubin Urine: NEGATIVE
Glucose, UA: NEGATIVE mg/dL
Hgb urine dipstick: NEGATIVE
Ketones, ur: NEGATIVE mg/dL
Leukocytes,Ua: NEGATIVE
Nitrite: NEGATIVE
Protein, ur: NEGATIVE mg/dL
Specific Gravity, Urine: 1.027 (ref 1.005–1.030)
pH: 5 (ref 5.0–8.0)

## 2020-02-19 LAB — COMPREHENSIVE METABOLIC PANEL
ALT: 43 U/L (ref 0–44)
AST: 32 U/L (ref 15–41)
Albumin: 4.7 g/dL (ref 3.5–5.0)
Alkaline Phosphatase: 65 U/L (ref 38–126)
Anion gap: 10 (ref 5–15)
BUN: 19 mg/dL (ref 8–23)
CO2: 27 mmol/L (ref 22–32)
Calcium: 9.2 mg/dL (ref 8.9–10.3)
Chloride: 103 mmol/L (ref 98–111)
Creatinine, Ser: 1.19 mg/dL (ref 0.61–1.24)
GFR calc Af Amer: >60 mL/min (ref >60)
GFR calc non Af Amer: >60 mL/min (ref >60)
Glucose, Bld: 88 mg/dL (ref 70–99)
Potassium: 4.7 mmol/L (ref 3.5–5.1)
Sodium: 140 mmol/L (ref 135–145)
Total Bilirubin: 1.4 mg/dL — ABNORMAL HIGH (ref 0.3–1.2)
Total Protein: 7.6 g/dL (ref 6.5–8.1)

## 2020-02-19 LAB — CBC WITH DIFFERENTIAL/PLATELET
Abs Immature Granulocytes: 0.05 10*3/uL (ref 0.00–0.07)
Basophils Absolute: 0.1 10*3/uL (ref 0.0–0.1)
Basophils Relative: 1 %
Eosinophils Absolute: 0.3 10*3/uL (ref 0.0–0.5)
Eosinophils Relative: 4 %
HCT: 40.8 % (ref 39.0–52.0)
Hemoglobin: 13.2 g/dL (ref 13.0–17.0)
Immature Granulocytes: 1 %
Lymphocytes Relative: 36 %
Lymphs Abs: 2.3 10*3/uL (ref 0.7–4.0)
MCH: 26.1 pg (ref 26.0–34.0)
MCHC: 32.4 g/dL (ref 30.0–36.0)
MCV: 80.8 fL (ref 80.0–100.0)
Monocytes Absolute: 0.5 10*3/uL (ref 0.1–1.0)
Monocytes Relative: 8 %
Neutro Abs: 3.3 10*3/uL (ref 1.7–7.7)
Neutrophils Relative %: 50 %
Platelets: 175 10*3/uL (ref 150–400)
RBC: 5.05 MIL/uL (ref 4.22–5.81)
RDW: 15.5 % (ref 11.5–15.5)
WBC: 6.4 10*3/uL (ref 4.0–10.5)
nRBC: 0 % (ref 0.0–0.2)

## 2020-02-19 LAB — SURGICAL PCR SCREEN
MRSA, PCR: NEGATIVE
Staphylococcus aureus: NEGATIVE

## 2020-02-25 ENCOUNTER — Other Ambulatory Visit: Payer: Self-pay

## 2020-02-25 ENCOUNTER — Other Ambulatory Visit
Admission: RE | Admit: 2020-02-25 | Discharge: 2020-02-25 | Disposition: A | Discharge status: Home / Self Care | Payer: Managed Care, Other (non HMO) | Source: Ambulatory Visit | Attending: Surgery | Admitting: Surgery

## 2020-02-25 DIAGNOSIS — Z01812 Encounter for preprocedural laboratory examination: Secondary | ICD-10-CM | POA: Insufficient documentation

## 2020-02-25 DIAGNOSIS — Z20822 Contact with and (suspected) exposure to covid-19: Secondary | ICD-10-CM | POA: Diagnosis not present

## 2020-02-26 LAB — SARS CORONAVIRUS 2 (TAT 6-24 HRS): SARS Coronavirus 2: NEGATIVE

## 2020-02-27 ENCOUNTER — Ambulatory Visit: Payer: Managed Care, Other (non HMO) | Admitting: Anesthesiology

## 2020-02-27 ENCOUNTER — Ambulatory Visit
Admission: RE | Admit: 2020-02-27 | Discharge: 2020-02-28 | Disposition: A | Discharge status: Home Health | Payer: Managed Care, Other (non HMO) | Source: Ambulatory Visit | Attending: Surgery | Admitting: Surgery

## 2020-02-27 ENCOUNTER — Ambulatory Visit: Payer: Managed Care, Other (non HMO)

## 2020-02-27 ENCOUNTER — Encounter: Payer: Self-pay | Admitting: Surgery

## 2020-02-27 ENCOUNTER — Encounter
Admission: RE | Disposition: A | Discharge status: Home Health | Payer: Self-pay | Source: Ambulatory Visit | Attending: Surgery

## 2020-02-27 ENCOUNTER — Other Ambulatory Visit: Payer: Self-pay

## 2020-02-27 DIAGNOSIS — I10 Essential (primary) hypertension: Secondary | ICD-10-CM | POA: Diagnosis not present

## 2020-02-27 DIAGNOSIS — Z79899 Other long term (current) drug therapy: Secondary | ICD-10-CM | POA: Insufficient documentation

## 2020-02-27 DIAGNOSIS — E785 Hyperlipidemia, unspecified: Secondary | ICD-10-CM | POA: Diagnosis not present

## 2020-02-27 DIAGNOSIS — Z87891 Personal history of nicotine dependence: Secondary | ICD-10-CM | POA: Insufficient documentation

## 2020-02-27 DIAGNOSIS — M1712 Unilateral primary osteoarthritis, left knee: Secondary | ICD-10-CM | POA: Diagnosis not present

## 2020-02-27 DIAGNOSIS — G4733 Obstructive sleep apnea (adult) (pediatric): Secondary | ICD-10-CM | POA: Insufficient documentation

## 2020-02-27 DIAGNOSIS — K219 Gastro-esophageal reflux disease without esophagitis: Secondary | ICD-10-CM | POA: Diagnosis not present

## 2020-02-27 DIAGNOSIS — N400 Enlarged prostate without lower urinary tract symptoms: Secondary | ICD-10-CM | POA: Diagnosis not present

## 2020-02-27 DIAGNOSIS — Z96652 Presence of left artificial knee joint: Secondary | ICD-10-CM

## 2020-02-27 DIAGNOSIS — Z7982 Long term (current) use of aspirin: Secondary | ICD-10-CM | POA: Diagnosis not present

## 2020-02-27 DIAGNOSIS — Z792 Long term (current) use of antibiotics: Secondary | ICD-10-CM | POA: Insufficient documentation

## 2020-02-27 HISTORY — PX: TOTAL KNEE ARTHROPLASTY: SHX125

## 2020-02-27 LAB — ABO/RH: ABO/RH(D): A POS

## 2020-02-27 SURGERY — ARTHROPLASTY, KNEE, TOTAL
Anesthesia: Spinal | Site: Knee | Laterality: Left

## 2020-02-27 MED ORDER — SODIUM CHLORIDE 0.9 % IV SOLN
INTRAVENOUS | Status: DC | PRN
  Administered 2020-02-27: 60 mL

## 2020-02-27 MED ORDER — MIDAZOLAM HCL 2 MG/2ML IJ SOLN
INTRAMUSCULAR | Status: AC
  Filled 2020-02-27: qty 2

## 2020-02-27 MED ORDER — ASPIRIN EC 81 MG PO TBEC
81.0000 mg | DELAYED_RELEASE_TABLET | Freq: Every day | ORAL | Status: DC
  Administered 2020-02-28: 81 mg via ORAL
  Filled 2020-02-27: qty 1

## 2020-02-27 MED ORDER — KETOROLAC TROMETHAMINE 15 MG/ML IJ SOLN
INTRAMUSCULAR | Status: AC
  Administered 2020-02-27: 7.5 mg via INTRAVENOUS
  Filled 2020-02-27: qty 1

## 2020-02-27 MED ORDER — CHLORHEXIDINE GLUCONATE 0.12 % MT SOLN
OROMUCOSAL | Status: AC
  Administered 2020-02-27: 15 mL via OROMUCOSAL
  Filled 2020-02-27: qty 15

## 2020-02-27 MED ORDER — KETOROLAC TROMETHAMINE 15 MG/ML IJ SOLN: 15.0000 mg | Freq: Once | INTRAMUSCULAR | Status: AC

## 2020-02-27 MED ORDER — PROPOFOL 500 MG/50ML IV EMUL
INTRAVENOUS | Status: DC | PRN
  Administered 2020-02-27: 50 ug/kg/min via INTRAVENOUS

## 2020-02-27 MED ORDER — LACTATED RINGERS IV SOLN: INTRAVENOUS | Status: DC

## 2020-02-27 MED ORDER — ACETAMINOPHEN 10 MG/ML IV SOLN
INTRAVENOUS | Status: DC | PRN
  Administered 2020-02-27: 1000 mg via INTRAVENOUS

## 2020-02-27 MED ORDER — PROPOFOL 10 MG/ML IV BOLUS
INTRAVENOUS | Status: DC | PRN
  Administered 2020-02-27: 30 mg via INTRAVENOUS

## 2020-02-27 MED ORDER — BEE POLLEN 1000 MG PO TABS: ORAL_TABLET | Freq: Every day | ORAL | Status: DC

## 2020-02-27 MED ORDER — MIDAZOLAM HCL 5 MG/5ML IJ SOLN
INTRAMUSCULAR | Status: DC | PRN
  Administered 2020-02-27: 2 mg via INTRAVENOUS

## 2020-02-27 MED ORDER — KETOROLAC TROMETHAMINE 15 MG/ML IJ SOLN
INTRAMUSCULAR | Status: AC
  Administered 2020-02-27: 15 mg via INTRAVENOUS
  Filled 2020-02-27: qty 1

## 2020-02-27 MED ORDER — ENOXAPARIN SODIUM 40 MG/0.4ML ~~LOC~~ SOLN
40.0000 mg | SUBCUTANEOUS | Status: DC
  Administered 2020-02-28: 40 mg via SUBCUTANEOUS
  Filled 2020-02-27: qty 0.4

## 2020-02-27 MED ORDER — BUPIVACAINE HCL (PF) 0.5 % IJ SOLN
INTRAMUSCULAR | Status: AC
  Filled 2020-02-27: qty 30

## 2020-02-27 MED ORDER — ACETAMINOPHEN 500 MG PO TABS
1000.0000 mg | ORAL_TABLET | Freq: Four times a day (QID) | ORAL | Status: AC
  Administered 2020-02-27 – 2020-02-28 (×2): 1000 mg via ORAL
  Filled 2020-02-27 (×3): qty 2

## 2020-02-27 MED ORDER — METOCLOPRAMIDE HCL 5 MG/ML IJ SOLN: 5.0000 mg | Freq: Three times a day (TID) | INTRAMUSCULAR | Status: DC | PRN

## 2020-02-27 MED ORDER — BUPIVACAINE HCL (PF) 0.5 % IJ SOLN
INTRAMUSCULAR | Status: DC | PRN
  Administered 2020-02-27: 3 mL

## 2020-02-27 MED ORDER — DOCUSATE SODIUM 100 MG PO CAPS
100.0000 mg | ORAL_CAPSULE | Freq: Two times a day (BID) | ORAL | Status: DC
  Administered 2020-02-27 – 2020-02-28 (×2): 100 mg via ORAL
  Filled 2020-02-27 (×2): qty 1

## 2020-02-27 MED ORDER — CHLORHEXIDINE GLUCONATE 0.12 % MT SOLN
15.0000 mL | Freq: Once | OROMUCOSAL | Status: AC
  Administered 2020-02-27: 15 mL via OROMUCOSAL

## 2020-02-27 MED ORDER — KETOROLAC TROMETHAMINE 15 MG/ML IJ SOLN
7.5000 mg | Freq: Four times a day (QID) | INTRAMUSCULAR | Status: AC
  Administered 2020-02-27 – 2020-02-28 (×3): 7.5 mg via INTRAVENOUS
  Filled 2020-02-27 (×3): qty 1

## 2020-02-27 MED ORDER — DONEPEZIL HCL 5 MG PO TABS
5.0000 mg | ORAL_TABLET | Freq: Every day | ORAL | Status: DC
  Administered 2020-02-27: 5 mg via ORAL
  Filled 2020-02-27: qty 1

## 2020-02-27 MED ORDER — CEFAZOLIN SODIUM-DEXTROSE 2-4 GM/100ML-% IV SOLN
2.0000 g | INTRAVENOUS | Status: AC
  Administered 2020-02-27: 2 g via INTRAVENOUS

## 2020-02-27 MED ORDER — FENTANYL CITRATE (PF) 100 MCG/2ML IJ SOLN: 25.0000 ug | INTRAMUSCULAR | Status: DC | PRN

## 2020-02-27 MED ORDER — ACETAMINOPHEN 325 MG PO TABS: 325.0000 mg | ORAL_TABLET | Freq: Four times a day (QID) | ORAL | Status: DC | PRN

## 2020-02-27 MED ORDER — EPINEPHRINE PF 1 MG/ML IJ SOLN
INTRAMUSCULAR | Status: AC
  Filled 2020-02-27: qty 1

## 2020-02-27 MED ORDER — HYDROCODONE-ACETAMINOPHEN 7.5-325 MG PO TABS
1.0000 | ORAL_TABLET | Freq: Once | ORAL | Status: AC | PRN
  Administered 2020-02-27: 1 via ORAL
  Filled 2020-02-27: qty 1

## 2020-02-27 MED ORDER — MAGNESIUM HYDROXIDE 400 MG/5ML PO SUSP
30.0000 mL | Freq: Every day | ORAL | Status: DC | PRN
  Filled 2020-02-27: qty 30

## 2020-02-27 MED ORDER — TRANEXAMIC ACID 1000 MG/10ML IV SOLN
INTRAVENOUS | Status: DC | PRN
  Administered 2020-02-27: 1000 mg via TOPICAL

## 2020-02-27 MED ORDER — BUPIVACAINE-EPINEPHRINE (PF) 0.5% -1:200000 IJ SOLN
INTRAMUSCULAR | Status: DC | PRN
  Administered 2020-02-27: 30 mL

## 2020-02-27 MED ORDER — AMLODIPINE BESYLATE 5 MG PO TABS
5.0000 mg | ORAL_TABLET | Freq: Every day | ORAL | Status: DC
  Administered 2020-02-27: 5 mg via ORAL
  Filled 2020-02-27: qty 1

## 2020-02-27 MED ORDER — TRANEXAMIC ACID 1000 MG/10ML IV SOLN
INTRAVENOUS | Status: AC
  Filled 2020-02-27: qty 10

## 2020-02-27 MED ORDER — DIPHENHYDRAMINE HCL 12.5 MG/5ML PO ELIX
12.5000 mg | ORAL_SOLUTION | ORAL | Status: DC | PRN
  Filled 2020-02-27: qty 10

## 2020-02-27 MED ORDER — IRBESARTAN 150 MG PO TABS
150.0000 mg | ORAL_TABLET | Freq: Every day | ORAL | Status: DC
  Administered 2020-02-28: 150 mg via ORAL
  Filled 2020-02-27: qty 1

## 2020-02-27 MED ORDER — ACETAMINOPHEN 500 MG PO TABS
ORAL_TABLET | ORAL | Status: AC
  Administered 2020-02-27: 1000 mg via ORAL
  Filled 2020-02-27: qty 2

## 2020-02-27 MED ORDER — PROPOFOL 500 MG/50ML IV EMUL
INTRAVENOUS | Status: AC
  Filled 2020-02-27: qty 50

## 2020-02-27 MED ORDER — TRAMADOL HCL 50 MG PO TABS: 50.0000 mg | ORAL_TABLET | Freq: Four times a day (QID) | ORAL | Status: DC | PRN

## 2020-02-27 MED ORDER — MEPERIDINE HCL 50 MG/ML IJ SOLN: 6.2500 mg | INTRAMUSCULAR | Status: DC | PRN

## 2020-02-27 MED ORDER — ONDANSETRON HCL 4 MG/2ML IJ SOLN: 4.0000 mg | Freq: Four times a day (QID) | INTRAMUSCULAR | Status: DC | PRN

## 2020-02-27 MED ORDER — CEFAZOLIN SODIUM-DEXTROSE 2-4 GM/100ML-% IV SOLN
INTRAVENOUS | Status: AC
  Filled 2020-02-27: qty 100

## 2020-02-27 MED ORDER — METOCLOPRAMIDE HCL 10 MG PO TABS: 5.0000 mg | ORAL_TABLET | Freq: Three times a day (TID) | ORAL | Status: DC | PRN

## 2020-02-27 MED ORDER — SODIUM CHLORIDE FLUSH 0.9 % IV SOLN
INTRAVENOUS | Status: AC
  Filled 2020-02-27: qty 40

## 2020-02-27 MED ORDER — BUPIVACAINE LIPOSOME 1.3 % IJ SUSP
INTRAMUSCULAR | Status: AC
  Filled 2020-02-27: qty 20

## 2020-02-27 MED ORDER — FLEET ENEMA 7-19 GM/118ML RE ENEM: 1.0000 | ENEMA | Freq: Once | RECTAL | Status: DC | PRN

## 2020-02-27 MED ORDER — PROMETHAZINE HCL 25 MG/ML IJ SOLN: 6.2500 mg | INTRAMUSCULAR | Status: DC | PRN

## 2020-02-27 MED ORDER — FENTANYL CITRATE (PF) 100 MCG/2ML IJ SOLN
INTRAMUSCULAR | Status: DC | PRN
  Administered 2020-02-27 (×2): 50 ug via INTRAVENOUS

## 2020-02-27 MED ORDER — ACETAMINOPHEN 325 MG PO TABS: 325.0000 mg | ORAL_TABLET | ORAL | Status: DC | PRN

## 2020-02-27 MED ORDER — ACETAMINOPHEN 160 MG/5ML PO SOLN
325.0000 mg | ORAL | Status: DC | PRN
  Filled 2020-02-27: qty 20.3

## 2020-02-27 MED ORDER — OXYCODONE HCL 5 MG PO TABS
5.0000 mg | ORAL_TABLET | ORAL | Status: DC | PRN
  Administered 2020-02-27: 5 mg via ORAL
  Administered 2020-02-27 – 2020-02-28 (×3): 10 mg via ORAL
  Filled 2020-02-27: qty 1
  Filled 2020-02-27 (×4): qty 2

## 2020-02-27 MED ORDER — ONDANSETRON HCL 4 MG PO TABS: 4.0000 mg | ORAL_TABLET | Freq: Four times a day (QID) | ORAL | Status: DC | PRN

## 2020-02-27 MED ORDER — ESMOLOL HCL 100 MG/10ML IV SOLN
INTRAVENOUS | Status: DC | PRN
Start: 2020-02-27 — End: 2020-02-27
  Administered 2020-02-27 (×3): 20 mg via INTRAVENOUS

## 2020-02-27 MED ORDER — ORAL CARE MOUTH RINSE: 15.0000 mL | Freq: Once | OROMUCOSAL | Status: AC

## 2020-02-27 MED ORDER — CEFAZOLIN SODIUM-DEXTROSE 2-4 GM/100ML-% IV SOLN
INTRAVENOUS | Status: AC
  Administered 2020-02-27: 2 g via INTRAVENOUS
  Filled 2020-02-27: qty 100

## 2020-02-27 MED ORDER — HYDROCODONE-ACETAMINOPHEN 7.5-325 MG PO TABS
ORAL_TABLET | ORAL | Status: AC
  Filled 2020-02-27: qty 1

## 2020-02-27 MED ORDER — PANTOPRAZOLE SODIUM 40 MG PO TBEC
40.0000 mg | DELAYED_RELEASE_TABLET | Freq: Every day | ORAL | Status: DC
  Administered 2020-02-28: 40 mg via ORAL
  Filled 2020-02-27: qty 1

## 2020-02-27 MED ORDER — BUMETANIDE 1 MG PO TABS
1.0000 mg | ORAL_TABLET | Freq: Every day | ORAL | Status: DC
  Administered 2020-02-28: 1 mg via ORAL
  Filled 2020-02-27 (×2): qty 1

## 2020-02-27 MED ORDER — BISACODYL 10 MG RE SUPP
10.0000 mg | Freq: Every day | RECTAL | Status: DC | PRN
  Filled 2020-02-27: qty 1

## 2020-02-27 MED ORDER — POTASSIUM CHLORIDE CRYS ER 10 MEQ PO TBCR
10.0000 meq | EXTENDED_RELEASE_TABLET | Freq: Two times a day (BID) | ORAL | Status: DC
  Administered 2020-02-27 – 2020-02-28 (×2): 10 meq via ORAL
  Filled 2020-02-27 (×2): qty 1

## 2020-02-27 MED ORDER — SODIUM CHLORIDE 0.9 % IV SOLN: INTRAVENOUS | Status: DC

## 2020-02-27 MED ORDER — ACETAMINOPHEN 10 MG/ML IV SOLN
INTRAVENOUS | Status: AC
  Filled 2020-02-27: qty 100

## 2020-02-27 MED ORDER — CEFAZOLIN SODIUM-DEXTROSE 2-4 GM/100ML-% IV SOLN
2.0000 g | Freq: Four times a day (QID) | INTRAVENOUS | Status: AC
  Administered 2020-02-27 – 2020-02-28 (×2): 2 g via INTRAVENOUS
  Filled 2020-02-27 (×3): qty 100

## 2020-02-27 MED ORDER — HYDROMORPHONE HCL 1 MG/ML IJ SOLN: 0.2500 mg | INTRAMUSCULAR | Status: DC | PRN

## 2020-02-27 MED ORDER — FENTANYL CITRATE (PF) 100 MCG/2ML IJ SOLN
INTRAMUSCULAR | Status: AC
  Filled 2020-02-27: qty 2

## 2020-02-27 SURGICAL SUPPLY — 61 items
BLADE SAW SAG 25X90X1.19 (BLADE) ×2 IMPLANT
BLADE SURG SZ20 CARB STEEL (BLADE) ×2 IMPLANT
BNDG ELASTIC 6X5.8 VLCR NS LF (GAUZE/BANDAGES/DRESSINGS) ×2 IMPLANT
CANISTER SUCT 1200ML W/VALVE (MISCELLANEOUS) ×2 IMPLANT
CANISTER SUCT 3000ML PPV (MISCELLANEOUS) ×2 IMPLANT
CEMENT BONE R 1X40 (Cement) ×4 IMPLANT
CEMENT VACUUM MIXING SYSTEM (MISCELLANEOUS) ×2 IMPLANT
CHLORAPREP W/TINT 26 (MISCELLANEOUS) ×2 IMPLANT
COMP FEMORAL CRUC LEFT 67.5MM (Joint) ×2 IMPLANT
COMPONENT FEMRL CRUC LT 67.5MM (Joint) ×1 IMPLANT
COOLER POLAR GLACIER W/PUMP (MISCELLANEOUS) ×2 IMPLANT
COVER MAYO STAND REUSABLE (DRAPES) ×2 IMPLANT
COVER WAND RF STERILE (DRAPES) ×2 IMPLANT
CUFF TOURN SGL QUICK 24 (TOURNIQUET CUFF)
CUFF TOURN SGL QUICK 30 (TOURNIQUET CUFF)
CUFF TRNQT CYL 24X4X16.5-23 (TOURNIQUET CUFF) IMPLANT
CUFF TRNQT CYL 30X4X21-28X (TOURNIQUET CUFF) IMPLANT
DRAPE 3/4 80X56 (DRAPES) ×2 IMPLANT
DRAPE IMP U-DRAPE 54X76 (DRAPES) ×2 IMPLANT
DRSG OPSITE POSTOP 4X10 (GAUZE/BANDAGES/DRESSINGS) ×2 IMPLANT
DRSG OPSITE POSTOP 4X8 (GAUZE/BANDAGES/DRESSINGS) ×2 IMPLANT
ELECT CAUTERY BLADE 6.4 (BLADE) ×2 IMPLANT
ELECT REM PT RETURN 9FT ADLT (ELECTROSURGICAL) ×2
ELECTRODE REM PT RTRN 9FT ADLT (ELECTROSURGICAL) ×1 IMPLANT
GLOVE BIO SURGEON STRL SZ7.5 (GLOVE) ×8 IMPLANT
GLOVE BIO SURGEON STRL SZ8 (GLOVE) ×8 IMPLANT
GLOVE BIOGEL PI IND STRL 8 (GLOVE) ×1 IMPLANT
GLOVE BIOGEL PI INDICATOR 8 (GLOVE) ×1
GLOVE INDICATOR 8.0 STRL GRN (GLOVE) ×2 IMPLANT
GOWN STRL REUS W/ TWL LRG LVL3 (GOWN DISPOSABLE) ×1 IMPLANT
GOWN STRL REUS W/ TWL XL LVL3 (GOWN DISPOSABLE) ×1 IMPLANT
GOWN STRL REUS W/TWL LRG LVL3 (GOWN DISPOSABLE) ×1
GOWN STRL REUS W/TWL XL LVL3 (GOWN DISPOSABLE) ×1
HOLDER FOLEY CATH W/STRAP (MISCELLANEOUS) IMPLANT
HOOD PEEL AWAY FLYTE STAYCOOL (MISCELLANEOUS) ×8 IMPLANT
INSERT TIB BEARING 75 10 (Insert) ×2 IMPLANT
KIT TURNOVER KIT A (KITS) ×2 IMPLANT
NDL SAFETY ECLIPSE 18X1.5 (NEEDLE) ×2 IMPLANT
NEEDLE HYPO 18GX1.5 SHARP (NEEDLE) ×2
NEEDLE SPNL 20GX3.5 QUINCKE YW (NEEDLE) ×2 IMPLANT
NS IRRIG 1000ML POUR BTL (IV SOLUTION) ×2 IMPLANT
PACK TOTAL KNEE (MISCELLANEOUS) ×2 IMPLANT
PAD WRAPON POLAR KNEE (MISCELLANEOUS) ×1 IMPLANT
PATELLA STD 34X8.5 (Orthopedic Implant) ×2 IMPLANT
PENCIL SMOKE EVACUATOR (MISCELLANEOUS) ×2 IMPLANT
PLATE KNEE TIBIAL 75MM FIXED (Plate) ×2 IMPLANT
PULSAVAC PLUS IRRIG FAN TIP (DISPOSABLE) ×2
SOL .9 NS 3000ML IRR  AL (IV SOLUTION) ×1
SOL .9 NS 3000ML IRR UROMATIC (IV SOLUTION) ×1 IMPLANT
STAPLER SKIN PROX 35W (STAPLE) ×2 IMPLANT
SUCTION FRAZIER HANDLE 10FR (MISCELLANEOUS) ×1
SUCTION TUBE FRAZIER 10FR DISP (MISCELLANEOUS) ×1 IMPLANT
SUT VIC AB 0 CT1 36 (SUTURE) ×6 IMPLANT
SUT VIC AB 2-0 CT1 27 (SUTURE) ×4
SUT VIC AB 2-0 CT1 TAPERPNT 27 (SUTURE) ×4 IMPLANT
SYR 10ML LL (SYRINGE) ×2 IMPLANT
SYR 20ML LL LF (SYRINGE) ×2 IMPLANT
SYR 30ML LL (SYRINGE) ×6 IMPLANT
TIP FAN IRRIG PULSAVAC PLUS (DISPOSABLE) ×1 IMPLANT
TRAY FOLEY MTR SLVR 16FR STAT (SET/KITS/TRAYS/PACK) IMPLANT
WRAPON POLAR PAD KNEE (MISCELLANEOUS) ×2

## 2020-02-27 NOTE — Progress Notes (Signed)
PHARMACIST - PHYSICIAN ORDER COMMUNICATION  CONCERNING: P&T Medication Policy on Herbal Medications  DESCRIPTION:  This patient's order for:  Bee Pollen  has been noted.  This product(s) is classified as an "herbal" or natural product. Due to a lack of definitive safety studies or FDA approval, nonstandard manufacturing practices, plus the potential risk of unknown drug-drug interactions while on inpatient medications, the Pharmacy and Therapeutics Committee does not permit the use of "herbal" or natural products of this type within [facility].   ACTION TAKEN: The pharmacy department is unable to verify this order at this time and your patient has been informed of this safety policy. Please reevaluate patient's clinical condition at discharge and address if the herbal or natural product(s) should be resumed at that time.

## 2020-02-27 NOTE — Anesthesia Postprocedure Evaluation (Signed)
Anesthesia Post Note  Patient: Richard Dean  Procedure(s) Performed: TOTAL KNEE ARTHROPLASTY (Left Knee)  Patient location during evaluation: PACU Anesthesia Type: Spinal Level of consciousness: awake and alert Pain management: pain level controlled Vital Signs Assessment: post-procedure vital signs reviewed and stable Respiratory status: spontaneous breathing and respiratory function stable Cardiovascular status: blood pressure returned to baseline and stable Postop Assessment: spinal receding Anesthetic complications: no   No complications documented.   Last Vitals:  Vitals:   02/27/20 1209 02/27/20 1228  BP: (!) 141/72 (!) 146/77  Pulse: 79 64  Resp: 19 18  Temp: (!) 36.2 C   SpO2: 100% 100%    Last Pain:  Vitals:   02/27/20 1209  TempSrc:   PainSc: 0-No pain                 Alphonsus Sias

## 2020-02-27 NOTE — H&P (Signed)
History of Present Illness: Richard Dean is a 69 y.o. male who presents today for his surgical history and physical for upcoming left total knee arthroplasty. Surgery scheduled with Dr. Roland Rack on 02/27/2020. The patient denies any changes in his medical history since he was last evaluated. Pain score today is a 5 out of 10. The patient denies any numbness or tingling to the left lower extremity. The patient denies any signs of infection at home such as fevers or chills. He denies any personal history of heart attack, stroke, asthma or COPD. No personal history of blood clots.   Past Medical History: . Benign essential hypertension 10/11/2016  . 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 Chronic osteoarthritis 10/11/2016  . Chronic reflux esophagitis 10/11/2016  . Lymphedema of right lower extremity 10/11/2016  . OSA on CPAP 10/11/2016   Past Surgical History: . Endoscopic right carpal tunnel release. 03/14/2019  Dr. Roland Rack  . Left hand surgery 2004  Necrotizing fasciitis   Past Family History: . No Known Problems Mother  . No Known Problems Father   Medications: . amLODIPine (NORVASC) 5 MG tablet Take 1 tablet (5 mg total) by mouth once daily 90 tablet 3  . aspirin 81 MG EC tablet Take 81 mg by mouth once daily.  . bee pollen 580 mg Cap Take by mouth.  . bumetanide (BUMEX) 1 MG tablet Take 1 tablet (1 mg total) by mouth once daily Take 1 mg by mouth daily. 90 tablet 3  . donepeziL (ARICEPT) 5 MG tablet Take 1 tablet (5 mg total) by mouth nightly 90 tablet 3  . doxycycline (ORACEA) 40 mg capsule Take 1 capsule by mouth once daily  . DULoxetine (CYMBALTA) 20 MG DR capsule Take 1 capsule (20 mg total) by mouth once daily 90 capsule 3  . ivermectin (SOOLANTRA) 1 % Crea Apply 1 each topically once daily. 90 g 1  . ketoconazole (NIZORAL) 2 % cream Apply 30 g topically 2 (two) times daily 2  . metroNIDAZOLE (METROCREAM) 0.75 % cream Apply 45 g topically once daily 6  . olmesartan  (BENICAR) 20 MG tablet TAKE 1 TABLET(20 MG) BY MOUTH EVERY [place] 90 tablet 3  . pantoprazole (PROTONIX) 40 MG DR tablet Take 1 tablet (40 mg total) by mouth once daily 90 tablet 3  . potassium chloride (KLOR-CON) 10 mEq ER tablet Take 1 tablet (10 mEq total) by mouth 2 (two) times daily 180 tablet 3  . TURMERIC ORAL Take 800 mg by mouth once daily   No current Epic-ordered facility-administered medications on file.   Allergies: No Known Allergies   Review of Systems:  A comprehensive 14 point ROS was performed, reviewed by me today, and the pertinent orthopaedic findings are documented in the HPI.  Physical Exam: BP 132/80 (BP Location: Left upper arm, Patient Position: Sitting, BP Cuff Size: Adult)  Ht 175.3 cm (5\' 9" )  Wt (!) 108.1 kg (238 lb 6.4 oz)  BMI 35.21 kg/m  General/Constitutional: The patient appears to be well-nourished, well-developed, and in no acute distress. Neuro/Psych: Normal mood and affect, oriented to person, place and time. Eyes: Non-icteric. Pupils are equal, round, and reactive to light, and exhibit synchronous movement. ENT: Unremarkable. Lymphatic: No palpable adenopathy. Respiratory: Lungs clear to auscultation, Normal chest excursion, No wheezes and Non-labored breathing Cardiovascular: Regular rate and rhythm. No murmurs. and No edema, swelling or tenderness, except as noted in detailed exam. Integumentary: No impressive skin lesions present, except as noted in detailed exam. Musculoskeletal:  Unremarkable, except as noted in detailed exam.  Leftknee exam: ALIGNMENT:Mild-moderatevarus SKIN:Unremarkable SWELLING:Mild EFFUSION:Small WARMTH:None TENDERNESS:Mildly tenderalongthe medial more so thanlateraljoint lines ROM:5-110degrees with mild discomfort in maximal flexion McMURRAY'S:Equivocally positive PATELLOFEMORAL:Normal tracking with no peri-patellar tenderness and negative apprehension sign CREPITUS:None LACHMAN'S:Negative PIVOT  SHIFT:Negative ANTERIOR DRAWER:Negative POSTERIOR DRAWER:Negative VARUS/VALGUS:Mild pseudolaxity to varus stressing  He is neurovascularly intact to the left lower extremity and foot.  Impression: Primary osteoarthritis of left knee.  Plan:  1. Treatment options were discussed today with the patient. 2. The patient is scheduled for a left total knee arthroplasty with Dr. Roland Rack on 02/27/2020. 3. The patient was instructed on the risk and benefits of surgery and wishes to proceed at this time. This document will serve as a surgical history and physical for the patient. 4. The patient will follow-up per standard postop protocol. They can call the clinic they have any questions, new symptoms develop or symptoms worsen.  The procedure was discussed with the patient, as were the potential risks (including bleeding, infection, nerve and/or blood vessel injury, persistent or recurrent pain, failure of the hardware, need for further surgery, blood clots, strokes, heart attacks and/or arhythmias, pneumonia, etc.) and benefits. The patient states his understanding and wishes to proceed.   H&P reviewed and patient re-examined. No changes.

## 2020-02-27 NOTE — Anesthesia Preprocedure Evaluation (Signed)
Anesthesia Evaluation  Patient identified by MRN, date of birth, ID band Patient awake    Reviewed: Allergy & Precautions, H&P , NPO status , reviewed documented beta blocker date and time   Airway Mallampati: III  TM Distance: >3 FB Neck ROM: full    Dental   Extremely poor dentition, many chipped/cracked. No loose:   Pulmonary sleep apnea and Continuous Positive Airway Pressure Ventilation , former smoker,    Pulmonary exam normal        Cardiovascular hypertension, Normal cardiovascular exam+ Valvular Problems/Murmurs   Hx murmur, no treatment   Neuro/Psych    GI/Hepatic GERD  Medicated and Controlled,  Endo/Other    Renal/GU      Musculoskeletal  (+) Arthritis ,   Abdominal   Peds  Hematology  (+) Blood dyscrasia, anemia ,   Anesthesia Other Findings Past Medical History: No date: BPH without obstruction/lower urinary tract symptoms No date: Erectile dysfunction No date: GERD (gastroesophageal reflux disease) No date: Heart murmur No date: History of skin cancer No date: HLD (hyperlipidemia) No date: Hypertension No date: Hypogonadism in male No date: Osteoarthritis No date: Sleep apnea     Comment:  CPAP No date: Urinary frequency  Past Surgical History: No date: BASAL CELL CARCINOMA EXCISION     Comment:  Head 03/14/2019: CARPAL TUNNEL RELEASE; Right     Comment:  Procedure: CARPAL TUNNEL RELEASE ENDOSCOPIC RIGHT;                Surgeon: Corky Mull, MD;  Location: [facility];                Service: Orthopedics;  Laterality: Right; No date: circumscision 12/10/2015: COLONOSCOPY WITH PROPOFOL; N/A     Comment:  Procedure: COLONOSCOPY WITH PROPOFOL;  Surgeon: Lucilla Lame, MD;  Location: [place];  Service:               Endoscopy;  Laterality: N/A; arm: spider bite; Left     Comment:  major reconstructive surgery for gangrene     Reproductive/Obstetrics                              Anesthesia Physical Anesthesia Plan  ASA: III  Anesthesia Plan: Spinal   Post-op Pain Management:    Induction: Intravenous  PONV Risk Score and Plan: 2 and Ondansetron, TIVA, Midazolam and Treatment may vary due to age or medical condition  Airway Management Planned: Nasal Cannula and Natural Airway  Additional Equipment:   Intra-op Plan:   Post-operative Plan:   Informed Consent: I have reviewed the patients History and Physical, chart, labs and discussed the procedure including the risks, benefits and alternatives for the proposed anesthesia with the patient or authorized representative who has indicated his/her understanding and acceptance.     Dental Advisory Given  Plan Discussed with: CRNA  Anesthesia Plan Comments:         Anesthesia Quick Evaluation

## 2020-02-27 NOTE — Anesthesia Procedure Notes (Signed)
Spinal  Patient location during procedure: OR Start time: 02/27/2020 7:36 AM Staffing Performed: resident/CRNA  Anesthesiologist: Alphonsus Sias, MD Resident/CRNA: Sharman Garrott, Einar Grad, CRNA Preanesthetic Checklist Completed: patient identified, IV checked, site marked, risks and benefits discussed, surgical consent, monitors and equipment checked, pre-op evaluation and timeout performed Spinal Block Patient position: sitting Prep: DuraPrep Patient monitoring: heart rate, cardiac monitor, continuous pulse ox and blood pressure Approach: midline Location: L3-4 Injection technique: single-shot Needle Needle type: Sprotte  Needle gauge: 24 G Needle length: 9 cm Assessment Sensory level: T4 Additional Notes +clear csf, good flow, -heme, -parathesia, procedure tolerated well

## 2020-02-27 NOTE — Transfer of Care (Signed)
Immediate Anesthesia Transfer of Care Note  Patient: Richard Dean  Procedure(s) Performed: TOTAL KNEE ARTHROPLASTY (Left Knee)  Patient Location: PACU  Anesthesia Type:Spinal  Level of Consciousness: awake, alert  and oriented  Airway & Oxygen Therapy: Patient Spontanous Breathing  Post-op Assessment: Report given to RN and Post -op Vital signs reviewed and stable  Post vital signs: Reviewed and stable  Last Vitals:  Vitals Value Taken Time  BP    Temp    Pulse    Resp    SpO2      Last Pain:  Vitals:   02/27/20 0627  TempSrc: Tympanic  PainSc: 0-No pain         Complications: No complications documented.

## 2020-02-27 NOTE — Evaluation (Signed)
Physical Therapy Evaluation Patient Details Name: Richard Dean MRN: [id number] DOB: 05-Apr-1951 Today's Date: 02/27/2020   History of Present Illness  Pt is a 69 yo male diagnosed with DJD of the L knee and is s/p elective L TKA.  PMH includes HTN, skin CA, OA, BPH, lymphedema of the RLE, OSA on CPAP, L hand Sx, and GERD.    Clinical Impression  Pt pleasant and motivated to participate during the session.  Pt required only minimal physical assistance during the session and was able to amb from bed to chair with good stability and without adverse symptoms other than L knee pain.  Pt's SpO2 and HR were WNL on room air.  Pt did present with significant deficits in L knee AROM at 13-64 deg but seemed limited by pain.  Overall pt should make good progress towards goals while in acute care and will benefit from HHPT services upon discharge to safely address deficits listed in patient problem list for decreased caregiver assistance and eventual return to PLOF.      Follow Up Recommendations Home health PT;Supervision - Intermittent    Equipment Recommendations  None recommended by PT    Recommendations for Other Services       Precautions / Restrictions Precautions Precautions: Fall Restrictions Weight Bearing Restrictions: Yes LLE Weight Bearing: Weight bearing as tolerated      Mobility  Bed Mobility Overal bed mobility: Needs Assistance Bed Mobility: Supine to Sit     Supine to sit: Min assist     General bed mobility comments: Min A for LLE and trunk control  Transfers Overall transfer level: Needs assistance Equipment used: Rolling walker (2 wheeled) Transfers: Sit to/from Stand Sit to Stand: Min guard         General transfer comment: Mod verbal cues for sequencing  Ambulation/Gait Ambulation/Gait assistance: Min guard Gait Distance (Feet): 5 Feet Assistive device: Rolling walker (2 wheeled) Gait Pattern/deviations: Step-to pattern;Decreased stance time -  left;Antalgic Gait velocity: decreased   General Gait Details: Mildly antalgic gait pattern with decreased LLE stance time but steady without LOB  Stairs            Wheelchair Mobility    Modified Rankin (Stroke Patients Only)       Balance Overall balance assessment: Needs assistance   Sitting balance-Leahy Scale: Normal     Standing balance support: Bilateral upper extremity supported Standing balance-Leahy Scale: Good                               Pertinent Vitals/Pain Pain Assessment: 0-10 Pain Score: 5  Pain Location: L knee Pain Descriptors / Indicators: Aching;Sore Pain Intervention(s): Premedicated before session;Monitored during session    [place] expects to be discharged to:: Private residence Living Arrangements: Spouse/significant other;Other relatives;Children Available Help at Discharge: Family Type of Home: House Home Access: Stairs to enter;Ramped entrance Entrance Stairs-Rails: Left Entrance Stairs-Number of Steps: 4 Home Layout: One level Home Equipment: Bedside commode;Walker - 2 wheels      Prior Function Level of Independence: Independent         Comments: Ind amb without an AD with occasional RW use if fatigued , Ind with ADLs, one fall in the last year     Hand Dominance        Extremity/Trunk Assessment   Upper Extremity Assessment Upper Extremity Assessment: Overall WFL for tasks assessed    Lower Extremity Assessment Lower Extremity Assessment: Generalized  weakness;LLE deficits/detail LLE Deficits / Details: BLE ankle AROM and strength WFL, sensation to light touch grossly intact to BLEs LLE: Unable to fully assess due to pain LLE Sensation: WNL       Communication   Communication: No difficulties  Cognition Arousal/Alertness: Awake/alert Behavior During Therapy: WFL for tasks assessed/performed Overall Cognitive Status: Within Functional Limits for tasks assessed                                         General Comments      Exercises Total Joint Exercises Ankle Circles/Pumps: AROM;Strengthening;Both;10 reps Quad Sets: Strengthening;Both;10 reps;5 reps Gluteal Sets: Strengthening;Both;10 reps Short Arc Quad: Strengthening;AROM;Left;10 reps Heel Slides: AROM;AAROM;Strengthening;Left;10 reps Hip ABduction/ADduction: AROM;AAROM;Strengthening;Left;10 reps Straight Leg Raises: AROM;AAROM;Left;10 reps Long Arc Quad: AROM;Strengthening;Left;10 reps Knee Flexion: AROM;Strengthening;Left;10 reps Goniometric ROM: L knee AROM: 13-64 deg Marching in Standing: AROM;Both;5 reps;Standing Other Exercises Other Exercises: HEP education per handout Other Exercises: Positioining education to promote L knee ext PROM   Assessment/Plan    PT Assessment Patient needs continued PT services  PT Problem List Decreased strength;Decreased range of motion;Decreased activity tolerance;Decreased balance;Decreased mobility;Decreased knowledge of use of DME;Pain       PT Treatment Interventions DME instruction;Gait training;Stair training;Functional mobility training;Therapeutic activities;Therapeutic exercise;Balance training;Patient/family education    PT Goals (Current goals can be found in the Care Plan section)  Acute Rehab PT Goals Patient Stated Goal: To walk better PT Goal Formulation: With patient Time For Goal Achievement: 03/11/20 Potential to Achieve Goals: Good    Frequency BID   Barriers to discharge        Co-evaluation               AM-PAC PT "6 Clicks" Mobility  Outcome Measure Help needed turning from your back to your side while in a flat bed without using bedrails?: A Little Help needed moving from lying on your back to sitting on the side of a flat bed without using bedrails?: A Little Help needed moving to and from a bed to a chair (including a wheelchair)?: A Little Help needed standing up from a chair using your arms (e.g.,  wheelchair or bedside chair)?: A Little Help needed to walk in hospital room?: A Little Help needed climbing 3-5 steps with a railing? : A Lot 6 Click Score: 17    End of Session Equipment Utilized During Treatment: Gait belt Activity Tolerance: Patient tolerated treatment well Patient left: in chair;with call bell/phone within reach;with chair alarm set;with SCD's reapplied;Other (comment);with family/visitor present (Polar care donned to L knee) Nurse Communication: Mobility status PT Visit Diagnosis: Muscle weakness (generalized) (M62.81);Other abnormalities of gait and mobility (R26.89);Pain Pain - Right/Left: Left Pain - part of body: Knee    Time: 5726-2035 PT Time Calculation (min) (ACUTE ONLY): 54 min   Charges:   PT Evaluation $PT Eval Moderate Complexity: 1 Mod PT Treatments $Therapeutic Exercise: 8-22 mins $Therapeutic Activity: 8-22 mins        D. Royetta Asal PT, DPT 02/27/20, 5:37 PM

## 2020-02-27 NOTE — Op Note (Signed)
02/27/2020  10:37 AM  Patient:   Richard Dean  Pre-Op Diagnosis:   Degenerative joint disease, left knee.  Post-Op Diagnosis:   Same  Procedure:   Left TKA using all-cemented [employer] Vanguard system with a 67.5 mm mm PCR femur, a 75 mm tibial tray with a 10 mm anterior stabilized E-poly insert, and a 34 x 8.5 mm all-poly 3-pegged domed patella.  Surgeon:   Pascal Lux, MD  Assistant:   Jodell Cipro, RNFA; Kirkland Hun, PA-S  Anesthesia:   Spinal  Findings:   As above  Complications:   None  EBL:   25 cc  Fluids:   700 cc crystalloid  UOP:   None  TT:   120 minutes at 300 mmHg  Drains:   None  Closure:   Staples  Implants:   As above  Brief Clinical Note:   The patient is a 69 year old male with a long history of progressively worsening left knee pain. The patient's symptoms have progressed despite medications, activity modification, injections, etc. The patient's history and examination were consistent with advanced degenerative joint disease of the left knee confirmed by plain radiographs. The patient presents at this time for a left total knee arthroplasty.  Procedure:   The patient was brought into the operating room. After adequate spinal anesthesia was obtained, the patient was lain in the supine position. The left lower extremity was prepped with ChloraPrep solution and draped sterilely. Preoperative antibiotics were administered. After verifying the proper laterality with a surgical timeout, the limb was exsanguinated with an Esmarch and the tourniquet inflated to 300 mmHg. A standard anterior approach to the knee was made through an approximately 7 inch incision. The incision was carried down through the subcutaneous tissues to expose superficial retinaculum. This was split the length of the incision and the medial flap elevated sufficiently to expose the medial retinaculum. The medial retinaculum was incised, leaving a 3-4 mm cuff of tissue on the patella. This  was extended distally along the medial border of the patellar tendon and proximally through the medial third of the quadriceps tendon. A subtotal fat pad excision was performed before the soft tissues were elevated off the anteromedial and anterolateral aspects of the proximal tibia to the level of the collateral ligaments. The anterior portions of the medial and lateral menisci were removed, as was the anterior cruciate ligament. With the knee flexed to 90, the external tibial guide was positioned and the appropriate proximal tibial cut made. This piece was taken to the back table where it was measured and found to be optimally replicated by a 75 mm component.  Attention was directed to the distal femur. The intramedullary canal was accessed through a 3/8" drill hole. The intramedullary guide was inserted and positioned in order to obtain a neutral flexion gap. The intercondylar block was positioned with care taken to avoid notching the anterior cortex of the femur. The appropriate cut was made. Next, the distal cutting block was placed at 5 of valgus alignment. Using the 9 mm slot, the distal cut was made. The distal femur was measured and found to be optimally replicated by the 68.3 mm component. The 67.5 mm 4-in-1 cutting block was positioned and first the posterior, then the posterior chamfer, the anterior chamfer, and finally the anterior cuts were made. At this point, the posterior portions medial and lateral menisci were removed. A trial reduction was performed using the appropriate femoral and tibial components with the 10 mm insert. This demonstrated excellent  stability to varus and valgus stressing both in flexion and extension while permitting full extension. Patella tracking was assessed and found to be excellent. Therefore, the tibial guide position was marked on the proximal tibia. The patella thickness was measured and found to be 25 mm. Therefore, the appropriate cut was made. The patellar  surface was measured and found to be optimally replicated by the 34 mm component. The three peg holes were drilled in place before the trial button was inserted. Patella tracking was assessed and found to be excellent, passing the "no thumb test". The lug holes were drilled into the distal femur before the trial component was removed, leaving only the tibial tray. The keel was then created using the appropriate tower, reamer, and punch.  The bony surfaces were prepared for cementing by irrigating them thoroughly with bacitracin saline solution via the jet lavage system. A bone plug was fashioned from some of the bone that had been removed previously and used to plug the distal femoral canal. In addition, 20 cc of Exparel diluted out to 60 cc with normal saline and 30 cc of 0.5% Sensorcaine were injected into the postero-medial and postero-lateral aspects of the knee, the medial and lateral gutter regions, and the peri-incisional tissues to help with postoperative analgesia. Meanwhile, the cement was being mixed on the back table. When it was ready, the tibial tray was cemented in first. The excess cement was removed using [employer] fast streamer. Next, the femoral component was impacted into place. Again, the excess cement was removed using [employer] fast streamer. The 10 mm trial insert was positioned and the knee brought into extension while the cement hardened. Finally, the patella was cemented into place and secured using the patellar clamp. Again, the excess cement was removed using [employer] fast streamer. Once the cement had hardened, the knee was placed through a range of motion with the findings as described above. Therefore, the trial insert was removed and, after verifying that no cement had been retained posteriorly, the permanent 10 mm anterior stabilized E-polyethylene insert was positioned and secured using the appropriate key locking mechanism. Again the knee was placed through a range of motion with the findings as  described above.  The wound was copiously irrigated with sterile saline solution using the jet lavage system before the quadriceps tendon and retinacular layer were reapproximated using #0 Vicryl interrupted sutures. The superficial retinacular layer also was closed using a running #0 Vicryl suture. A total of 10 cc of transexemic acid (TXA) was injected intra-articularly before the subcutaneous tissues were closed in several layers using 2-0 Vicryl interrupted sutures. The skin was closed using staples. A sterile honeycomb dressing was applied to the skin before the leg was wrapped with an Ace wrap to accommodate the Polar Care device. The patient was then awakened and returned to the recovery room in satisfactory condition after tolerating the procedure well.

## 2020-02-28 ENCOUNTER — Encounter: Payer: Self-pay | Admitting: Surgery

## 2020-02-28 DIAGNOSIS — M1712 Unilateral primary osteoarthritis, left knee: Secondary | ICD-10-CM | POA: Diagnosis not present

## 2020-02-28 LAB — BASIC METABOLIC PANEL
Anion gap: 7 (ref 5–15)
BUN: 15 mg/dL (ref 8–23)
CO2: 28 mmol/L (ref 22–32)
Calcium: 8.4 mg/dL — ABNORMAL LOW (ref 8.9–10.3)
Chloride: 106 mmol/L (ref 98–111)
Creatinine, Ser: 1.09 mg/dL (ref 0.61–1.24)
GFR calc Af Amer: >60 mL/min (ref >60)
GFR calc non Af Amer: >60 mL/min (ref >60)
Glucose, Bld: 122 mg/dL — ABNORMAL HIGH (ref 70–99)
Potassium: 4.5 mmol/L (ref 3.5–5.1)
Sodium: 141 mmol/L (ref 135–145)

## 2020-02-28 LAB — TYPE AND SCREEN
ABO/RH(D): A POS
Antibody Screen: POSITIVE
Unit division: 0
Unit division: 0

## 2020-02-28 LAB — BPAM RBC
Blood Product Expiration Date: 202107082359
Blood Product Expiration Date: 202107092359
Unit Type and Rh: 6200
Unit Type and Rh: 6200

## 2020-02-28 LAB — CBC
HCT: 35.1 % — ABNORMAL LOW (ref 39.0–52.0)
Hemoglobin: 11.4 g/dL — ABNORMAL LOW (ref 13.0–17.0)
MCH: 25.8 pg — ABNORMAL LOW (ref 26.0–34.0)
MCHC: 32.5 g/dL (ref 30.0–36.0)
MCV: 79.4 fL — ABNORMAL LOW (ref 80.0–100.0)
Platelets: 135 10*3/uL — ABNORMAL LOW (ref 150–400)
RBC: 4.42 MIL/uL (ref 4.22–5.81)
RDW: 15.9 % — ABNORMAL HIGH (ref 11.5–15.5)
WBC: 6.4 10*3/uL (ref 4.0–10.5)
nRBC: 0 % (ref 0.0–0.2)

## 2020-02-28 MED ORDER — TRAMADOL HCL 50 MG PO TABS: 50.0000 mg | ORAL_TABLET | Freq: Four times a day (QID) | ORAL | 0 refills | Status: DC | PRN

## 2020-02-28 MED ORDER — OXYCODONE HCL 5 MG PO TABS: 5.0000 mg | ORAL_TABLET | ORAL | 0 refills | Status: DC | PRN

## 2020-02-28 MED ORDER — ENOXAPARIN SODIUM 40 MG/0.4ML ~~LOC~~ SOLN: 40.0000 mg | SUBCUTANEOUS | 0 refills | Status: DC

## 2020-02-28 NOTE — Evaluation (Signed)
Occupational Therapy Evaluation Patient Details Name: Richard Dean MRN: [id number] DOB: [birth year] Today's Date: 02/28/2020    History of Present Illness Pt is a 69 yo male diagnosed with DJD of the L knee and is s/p elective L TKA.  PMH includes HTN, skin CA, OA, BPH, lymphedema of the RLE, OSA on CPAP, L hand Sx, and GERD.   Clinical Impression   Pt seen for OT evaluation this date, POD#1 from above surgery. Pt was independent in all ADLs prior to surgery, however occasionally using RW for mobility due to L knee pain. Pt is eager to return to PLOF with less pain and improved safety and independence. Pt currently requires minimal-moderate assist for LB dressing while in seated position due to pain and limited AROM of L knee. Pt instructed in polar care mgt, falls prevention strategies, home/routines modifications, DME/AE for LB bathing and dressing tasks, and compression stocking mgt. OTR provided education re: transfer technique for showering once cleared to do so.  Pt would benefit from skilled OT services including additional instruction in dressing techniques with or without assistive devices for dressing and bathing skills to support recall and carryover prior to discharge and ultimately to maximize safety, independence, and minimize falls risk and caregiver burden.  Pt has good support at home and has good awareness of safety precautions.  Do not currently anticipate any OT needs following this hospitalization.       Follow Up Recommendations  No OT follow up;Supervision - Intermittent    Equipment Recommendations  Other (comment) (grab bars in shower)    Recommendations for Other Services       Precautions / Restrictions Precautions Precautions: Fall Restrictions Weight Bearing Restrictions: Yes LLE Weight Bearing: Weight bearing as tolerated      Mobility Bed Mobility Overal bed mobility: Needs Assistance Bed Mobility: Supine to Sit     Supine to sit: Supervision      General bed mobility comments: not assessed, pt received and left in recliner  Transfers Overall transfer level: Needs assistance Equipment used: Rolling walker (2 wheeled) Transfers: Sit to/from Stand Sit to Stand: Min guard         General transfer comment: not assessed in today's session, but pt requires min physical guard and mod verbal cues for sequencing    Balance Overall balance assessment: Needs assistance Sitting-balance support: Feet supported Sitting balance-Leahy Scale: Normal     Standing balance support: Bilateral upper extremity supported Standing balance-Leahy Scale: Fair                             ADL either performed or assessed with clinical judgement   ADL Overall ADL's : Needs assistance/impaired                                       General ADL Comments: Pt grossly requires setup assist for seated upper body ADLs including feeding, grooming, upper body dressing and bathing.  Pt requires mod assist for lower body dressing 2/2 L knee pain/limited ROM.  Pt performing functional transfers with min A and RW.     Vision Baseline Vision/History: Wears glasses Wears Glasses: At all times Patient Visual Report: No change from baseline       Perception     Praxis      Pertinent Vitals/Pain Pain Assessment: 0-10 Pain Score: 6  Pain Location:  L knee Pain Descriptors / Indicators: Aching;Sore Pain Intervention(s): Limited activity within patient's tolerance;Monitored during session;Repositioned     Hand Dominance     Extremity/Trunk Assessment Upper Extremity Assessment Upper Extremity Assessment: Overall WFL for tasks assessed   Lower Extremity Assessment Lower Extremity Assessment: Generalized weakness;LLE deficits/detail LLE Deficits / Details: BLE ankle AROM and strength WFL, sensation to light touch grossly intact to BLEs LLE: Unable to fully assess due to pain LLE Sensation: WNL       Communication  Communication Communication: No difficulties   Cognition Arousal/Alertness: Awake/alert Behavior During Therapy: WFL for tasks assessed/performed Overall Cognitive Status: Within Functional Limits for tasks assessed                                 General Comments: grossly oriented, pleasant and engaged in therapy   General Comments       Exercises Total Joint Exercises Ankle Circles/Pumps: AROM;Strengthening;Both;10 reps Quad Sets: Strengthening;Both;10 reps;5 reps Gluteal Sets: Strengthening;Both;10 reps Short Arc Quad: Strengthening;AROM;Left;10 reps Heel Slides: AROM;AAROM;Strengthening;Left;10 reps Hip ABduction/ADduction: AROM;AAROM;Strengthening;Left;10 reps Straight Leg Raises: AROM;AAROM;Left;10 reps Long Arc Quad: AROM;Strengthening;Left;10 reps Knee Flexion: AROM;Strengthening;Left;10 reps Goniometric ROM: 2-93 Marching in Standing: AROM;Both;5 reps;Standing Other Exercises Other Exercises: provided education re: OT role and plan of care, fall and safety precautions, self care, compression stocking and polar care management, use of AE for LBD, DME recommendations, shower transfers   Shoulder Instructions      Home Living Family/patient expects to be discharged to:: Private residence Living Arrangements: Spouse/significant other;Other relatives;Children Available Help at Discharge: Family Type of Home: House Home Access: Stairs to enter;Ramped entrance Entrance Stairs-Number of Steps: 4 Entrance Stairs-Rails: Left Home Layout: One level     Bathroom Shower/Tub: Walk-in shower (threshold into shower)   Bathroom Toilet: Handicapped height     Home Equipment: Bedside commode;Walker - 2 wheels;Toilet riser;Hand held shower head;Grab bars - tub/shower   Additional Comments: Unclear if pt has toilet riser or BSC over toilet.  Pt has suction cup grab bars in shower, educated pt/wife on limiting use of these 2/2 safety concerns      Prior  Functioning/Environment Level of Independence: Independent        Comments: Ind amb without an AD with occasional RW use if fatigued , Ind with ADLs, one fall in the last year.  Pt drives and works, independent in medication management        OT Problem List: Decreased strength;Decreased range of motion;Impaired balance (sitting and/or standing);Decreased knowledge of use of DME or AE;Pain      OT Treatment/Interventions: Self-care/ADL training;Therapeutic exercise;DME and/or AE instruction;Therapeutic activities;Balance training;Patient/family education    OT Goals(Current goals can be found in the care plan section) Acute Rehab OT Goals Patient Stated Goal: To walk better OT Goal Formulation: With patient/family Time For Goal Achievement: 03/13/20 Potential to Achieve Goals: Good  OT Frequency: Min 1X/week   Barriers to D/C:            Co-evaluation              AM-PAC OT "6 Clicks" Daily Activity     Outcome Measure Help from another person eating meals?: None Help from another person taking care of personal grooming?: None Help from another person toileting, which includes using toliet, bedpan, or urinal?: A Little Help from another person bathing (including washing, rinsing, drying)?: A Lot Help from another person to put on and taking off regular upper  body clothing?: None Help from another person to put on and taking off regular lower body clothing?: A Lot 6 Click Score: 19   End of Session    Activity Tolerance: Patient tolerated treatment well Patient left: in chair;with call bell/phone within reach;with chair alarm set;with family/visitor present  OT Visit Diagnosis: Other abnormalities of gait and mobility (R26.89);Pain Pain - Right/Left: Left Pain - part of body: Knee                Time: 1030-1053 OT Time Calculation (min): 23 min Charges:  OT General Charges $OT Visit: 1 Visit OT Evaluation $OT Eval Moderate Complexity: 1 Mod OT  Treatments $Self Care/Home Management : 8-22 mins  Myrtie Hawk Kellee Sittner, OTR/L 02/28/20, 12:54 PM

## 2020-02-28 NOTE — Progress Notes (Signed)
Physical Therapy Treatment Patient Details Name: Richard Dean MRN: [id number] DOB: 1951-08-13 Today's Date: 02/28/2020    History of Present Illness Pt is a 69 yo male diagnosed with DJD of the L knee and is s/p elective L TKA.  PMH includes HTN, skin CA, OA, BPH, lymphedema of the RLE, OSA on CPAP, L hand Sx, and GERD.    PT Comments    Participated in exercises as described below.  OOB with rail but no physical assist.  Stood with min assist and is able to progress gait to door and back with RW and min assist.  Pt reports weakness but no LOB or buckling noted.  Remained in recliner after session.   Follow Up Recommendations  Home health PT;Supervision for mobility/OOB     Equipment Recommendations  None recommended by PT    Recommendations for Other Services       Precautions / Restrictions Precautions Precautions: Fall Restrictions Weight Bearing Restrictions: Yes    Mobility  Bed Mobility Overal bed mobility: Needs Assistance Bed Mobility: Supine to Sit     Supine to sit: Supervision     General bed mobility comments: able to manage LE on his own  Transfers Overall transfer level: Needs assistance Equipment used: Rolling walker (2 wheeled) Transfers: Sit to/from Stand Sit to Stand: Min guard            Ambulation/Gait Ambulation/Gait assistance: Min guard Gait Distance (Feet): 20 Feet Assistive device: Rolling walker (2 wheeled) Gait Pattern/deviations: Step-to pattern;Decreased step length - right;Decreased step length - left;Decreased stance time - left Gait velocity: decreased   General Gait Details: Mildly antalgic gait pattern with decreased LLE stance time but steady without LOB   Stairs             Wheelchair Mobility    Modified Rankin (Stroke Patients Only)       Balance Overall balance assessment: Needs assistance Sitting-balance support: Feet supported Sitting balance-Leahy Scale: Normal     Standing balance support:  Bilateral upper extremity supported Standing balance-Leahy Scale: Fair                              Cognition Arousal/Alertness: Awake/alert Behavior During Therapy: WFL for tasks assessed/performed Overall Cognitive Status: Within Functional Limits for tasks assessed                                        Exercises Total Joint Exercises Ankle Circles/Pumps: AROM;Strengthening;Both;10 reps Quad Sets: Strengthening;Both;10 reps;5 reps Gluteal Sets: Strengthening;Both;10 reps Short Arc Quad: Strengthening;AROM;Left;10 reps Heel Slides: AROM;AAROM;Strengthening;Left;10 reps Hip ABduction/ADduction: AROM;AAROM;Strengthening;Left;10 reps Straight Leg Raises: AROM;AAROM;Left;10 reps Long Arc Quad: AROM;Strengthening;Left;10 reps Knee Flexion: AROM;Strengthening;Left;10 reps Goniometric ROM: 2-93 Marching in Standing: AROM;Both;5 reps;Standing    General Comments        Pertinent Vitals/Pain Pain Assessment: 0-10 Pain Score: 6  Pain Location: L knee Pain Descriptors / Indicators: Aching;Sore Pain Intervention(s): Premedicated before session;Repositioned;Limited activity within patient's tolerance;Ice applied    Home Living                      Prior Function            PT Goals (current goals can now be found in the care plan section) Progress towards PT goals: Progressing toward goals    Frequency    BID  PT Plan Current plan remains appropriate    Co-evaluation              AM-PAC PT "6 Clicks" Mobility   Outcome Measure  Help needed turning from your back to your side while in a flat bed without using bedrails?: None Help needed moving from lying on your back to sitting on the side of a flat bed without using bedrails?: None Help needed moving to and from a bed to a chair (including a wheelchair)?: A Little Help needed standing up from a chair using your arms (e.g., wheelchair or bedside chair)?: A Little Help  needed to walk in hospital room?: A Little Help needed climbing 3-5 steps with a railing? : A Lot 6 Click Score: 19    End of Session Equipment Utilized During Treatment: Gait belt Activity Tolerance: Patient tolerated treatment well Patient left: in chair;with call bell/phone within reach;with chair alarm set;Other (comment);with family/visitor present Nurse Communication: Mobility status Pain - Right/Left: Left Pain - part of body: Knee     Time: 7741-4239 PT Time Calculation (min) (ACUTE ONLY): 18 min  Charges:  $Gait Training: 8-22 mins                    Chesley Noon, PTA 02/28/20, 10:30 AM

## 2020-02-28 NOTE — Progress Notes (Signed)
   Subjective: 1 [place] Post-Op Procedure(s) (LRB): TOTAL KNEE ARTHROPLASTY (Left) Patient reports pain as mild and moderate.   Patient is well, and has had no acute complaints or problems Denies any CP, SOB, ABD pain. We will continue therapy today.  Plan is to go Home after hospital stay.  Objective: Vital signs in last 24 hours: Temp:  [96.9 F (36.1 C)-98.4 F (36.9 C)] 98.1 F (36.7 C) (06/18 0750) Pulse Rate:  [56-90] 72 (06/18 0750) Resp:  [10-20] 18 (06/18 0750) BP: (125-174)/(71-94) 163/84 (06/18 0750) SpO2:  [93 %-100 %] 100 % (06/18 0750) Weight:  [433 kg] 107 kg (06/17 1649)  Intake/Output from previous [place]: 06/17 0701 - 06/18 0700 In: 2697.9 [P.O.:840; I.V.:1429; IV Piggyback:428.9] Out: 1175 [Urine:1150; Blood:25] Intake/Output this shift: No intake/output data recorded.  Recent Labs    02/28/20 0529  HGB 11.4*   Recent Labs    02/28/20 0529  WBC 6.4  RBC 4.42  HCT 35.1*  PLT 135*   Recent Labs    02/28/20 0529  NA 141  K 4.5  CL 106  CO2 28  BUN 15  CREATININE 1.09  GLUCOSE 122*  CALCIUM 8.4*   No results for input(s): LABPT, INR in the last 72 hours.  EXAM General - Patient is Alert, Appropriate and Oriented Extremity - Neurovascular intact Sensation intact distally Intact pulses distally Dorsiflexion/Plantar flexion intact Dressing - dressing C/D/I and no drainage Motor Function - intact, moving foot and toes well on exam.   Past Medical History:  Diagnosis Date  . BPH without obstruction/lower urinary tract symptoms   . Erectile dysfunction   . GERD (gastroesophageal reflux disease)   . Heart murmur   . History of skin cancer   . HLD (hyperlipidemia)   . Hypertension   . Hypogonadism in male   . Osteoarthritis   . Sleep apnea    CPAP  . Urinary frequency     Assessment/Plan:   1 [place] Post-Op Procedure(s) (LRB): TOTAL KNEE ARTHROPLASTY (Left) Active Problems:   Status post total knee replacement using cement,  left  Estimated body mass index is 34.85 kg/m as calculated from the following:   Height as of this encounter: 5\' 9"  (1.753 m).   Weight as of this encounter: 107 kg. Advance diet Up with therapy  Work on [employer] and VSS Pain controlled CM to assist with discharge to home with HHPT  DVT Prophylaxis - Lovenox, Foot Pumps and TED hose Weight-Bearing as tolerated to left leg   T. Rachelle Hora, PA-C [place] 02/28/2020, 8:04 AM

## 2020-02-28 NOTE — Plan of Care (Signed)
Patient discharged home per MD order. All discharge instructions given and all questions answered. Prescriptions given to patient. 

## 2020-02-28 NOTE — Progress Notes (Addendum)
Physical Therapy Treatment Patient Details Name: Richard Dean MRN: [id number] DOB: 12/04/1950 Today's Date: 02/28/2020    History of Present Illness Pt is a 69 yo male diagnosed with DJD of the L knee and is s/p elective L TKA.  PMH includes HTN, skin CA, OA, BPH, lymphedema of the RLE, OSA on CPAP, L hand Sx, and GERD.    PT Comments    Pt in recliner, ready for session.  Stood and able to progress gait to 100' with RW and min guard/assist with verbal cues to stay up into walker and for rest breaks as needed.  Often lets walker get too far out but corrects with cues.  Wife educated and aware of needed cues.  Gait belt issued for safety at home.    Discussed discharge plan.  While pt has not been able to walk full unit, he is able to walk household distances.  He has a wheelchair at home they plan to use to get in/out of the home.  Wife and family are able to provide support.  Possible discharge today depending on progression with therapy.  Pt and wife to discuss comfort of discharge home today but seem to be leaning towards going despite continued limitation of mobility.  Discussed with RN.    Returned to room per RN request to answer questions regarding side sleeping.  Pt sleeps on his side at home and is having trouble sleeping.  Pt educated that MD preference if for him to sleep on his back and risks/benefits discussed if he chooses to side sleep.  Wife stated "He just needs to get some rest"   Follow Up Recommendations  Home health PT;Supervision for mobility/OOB     Equipment Recommendations  None recommended by PT    Recommendations for Other Services       Precautions / Restrictions Precautions Precautions: Fall Restrictions Weight Bearing Restrictions: Yes LLE Weight Bearing: Weight bearing as tolerated    Mobility  Bed Mobility Overal bed mobility: Needs Assistance Bed Mobility: Supine to Sit     Supine to sit: Supervision     General bed mobility comments: in  recliner before and after session  Transfers Overall transfer level: Needs assistance Equipment used: Rolling walker (2 wheeled) Transfers: Sit to/from Stand Sit to Stand: Min guard         General transfer comment: not assessed in today's session, but pt requires min physical guard and mod verbal cues for sequencing  Ambulation/Gait Ambulation/Gait assistance: Min guard;Min assist Gait Distance (Feet): 100 Feet Assistive device: Rolling walker (2 wheeled) Gait Pattern/deviations: Step-to pattern;Decreased step length - right;Decreased step length - left;Decreased stance time - left Gait velocity: decreased   General Gait Details: Mildly antalgic gait pattern with decreased LLE stance time but steady without LOB   Stairs             Wheelchair Mobility    Modified Rankin (Stroke Patients Only)       Balance Overall balance assessment: Needs assistance Sitting-balance support: Feet supported Sitting balance-Leahy Scale: Normal     Standing balance support: Bilateral upper extremity supported Standing balance-Leahy Scale: Fair                              Cognition Arousal/Alertness: Awake/alert Behavior During Therapy: WFL for tasks assessed/performed Overall Cognitive Status: Within Functional Limits for tasks assessed  General Comments: grossly oriented, pleasant and engaged in therapy      Exercises Total Joint Exercises Ankle Circles/Pumps: AROM;Strengthening;Both;10 reps Quad Sets: Strengthening;Both;10 reps;5 reps Gluteal Sets: Strengthening;Both;10 reps Short Arc Quad: Strengthening;AROM;Left;10 reps Heel Slides: AROM;AAROM;Strengthening;Left;10 reps Hip ABduction/ADduction: AROM;AAROM;Strengthening;Left;10 reps Straight Leg Raises: AROM;AAROM;Left;10 reps Long Arc Quad: AROM;Strengthening;Left;10 reps Knee Flexion: AROM;Strengthening;Left;10 reps Goniometric ROM: 2-93 Marching in  Standing: AROM;Both;5 reps;Standing Other Exercises Other Exercises: safety education and discharge planning with pt and wife Other Exercises: provided education re: OT role and plan of care, fall and safety precautions, self care, compression stocking and polar care management, use of AE for LBD, DME recommendations, shower transfers    General Comments        Pertinent Vitals/Pain Pain Assessment: 0-10 Pain Score: 6  Pain Location: L knee Pain Descriptors / Indicators: Aching;Sore Pain Intervention(s): Limited activity within patient's tolerance;Monitored during session;Repositioned    Home Living Family/patient expects to be discharged to:: Private residence Living Arrangements: Spouse/significant other;Other relatives;Children Available Help at Discharge: Family Type of Home: House Home Access: Stairs to enter;Ramped entrance Entrance Stairs-Rails: Left Home Layout: One level Home Equipment: Bedside commode;Walker - 2 wheels;Toilet riser;Hand held shower head;Grab bars - tub/shower Additional Comments: Unclear if pt has toilet riser or BSC over toilet.  Pt has suction cup grab bars in shower, educated pt/wife on limiting use of these 2/2 safety concerns    Prior Function Level of Independence: Independent      Comments: Ind amb without an AD with occasional RW use if fatigued , Ind with ADLs, one fall in the last year.  Pt drives and works, independent in medication management   PT Goals (current goals can now be found in the care plan section) Acute Rehab PT Goals Patient Stated Goal: To walk better Progress towards PT goals: Progressing toward goals    Frequency    BID      PT Plan Current plan remains appropriate    Co-evaluation              AM-PAC PT "6 Clicks" Mobility   Outcome Measure  Help needed turning from your back to your side while in a flat bed without using bedrails?: None Help needed moving from lying on your back to sitting on the side  of a flat bed without using bedrails?: None Help needed moving to and from a bed to a chair (including a wheelchair)?: A Little Help needed standing up from a chair using your arms (e.g., wheelchair or bedside chair)?: A Little Help needed to walk in hospital room?: A Little Help needed climbing 3-5 steps with a railing? : A Lot 6 Click Score: 19    End of Session Equipment Utilized During Treatment: Gait belt Activity Tolerance: Patient tolerated treatment well Patient left: in chair;with call bell/phone within reach;with chair alarm set;Other (comment);with family/visitor present Nurse Communication: Mobility status Pain - Right/Left: Left Pain - part of body: Knee     Time: 1204-1230 PT Time Calculation (min) (ACUTE ONLY): 26 min  Charges:  $Gait Training: 8-22 mins $Therapeutic Exercise: 8-22 mins                    Chesley Noon, PTA 02/28/20, 12:58 PM

## 2020-02-28 NOTE — TOC Transition Note (Signed)
Transition of Care [facility]) - CM/SW Discharge Note   Patient Details  Name: Richard Dean MRN: [id number] Date of Birth: 12/14/1950  [facility] of [facility]) CM/SW Contact:  Su Hilt, RN Phone Number: 02/28/2020, 11:16 AM   Clinical Narrative:     Met with the patient to discuss DC plan and needs He lives at home with his wife and sister in law His wife provides transportation He has a RW and a BSC and Ramp at home no other DME needed, He is up to date with his PCP and can afford his medications [facility], NO additional needs Final next level of care: Home w Home Health Services Barriers to Discharge: Barriers Resolved   Patient Goals and CMS Choice Patient states their goals for this hospitalization and ongoing recovery are:: go home      Discharge Placement                       Discharge Plan and Services   Discharge Planning Services: CM Consult            DME Arranged: N/A         HH Arranged: PT, OT [place] Agency: [facility] ([facility] [employer]) Date [place]: 02/28/20 Time [place]: 1115 Representative spoke with at [place]: [place] ([place]) Interventions     Readmission Risk Interventions No flowsheet data found.

## 2020-02-28 NOTE — Progress Notes (Addendum)
  Subjective: 1 [place] Post-Op Procedure(s) (LRB): TOTAL KNEE ARTHROPLASTY (Left) Patient reports pain as mild.   Patient is well, and has had no acute complaints or problems Plan is to go Home after hospital stay. Negative for chest pain and shortness of breath Fever: no Gastrointestinal: Negative for nausea and vomiting  Objective: Vital signs in last 24 hours: Temp:  [96.9 F (36.1 C)-98.4 F (36.9 C)] 97.7 F (36.5 C) (06/18 0444) Pulse Rate:  [56-90] 68 (06/18 0444) Resp:  [10-20] 16 (06/18 0444) BP: (125-174)/(71-94) 150/91 (06/18 0444) SpO2:  [93 %-100 %] 99 % (06/18 0444) Weight:  [237 kg] 107 kg (06/17 1649)  Intake/Output from previous [place]:  Intake/Output Summary (Last 24 hours) at 02/28/2020 0657 Last data filed at 02/28/2020 0444 Gross per 24 hour  Intake 2697.92 ml  Output 1175 ml  Net 1522.92 ml    Intake/Output this shift: Total I/O In: 1177.9 [P.O.:120; I.V.:829; IV Piggyback:228.9] Out: 425 [Urine:425]  Labs: Recent Labs    02/28/20 0529  HGB 11.4*   Recent Labs    02/28/20 0529  WBC 6.4  RBC 4.42  HCT 35.1*  PLT 135*   Recent Labs    02/28/20 0529  NA 141  K 4.5  CL 106  CO2 28  BUN 15  CREATININE 1.09  GLUCOSE 122*  CALCIUM 8.4*   No results for input(s): LABPT, INR in the last 72 hours.   EXAM General - Patient is Alert and Oriented Extremity - Neurovascular intact Sensation intact distally Compartment soft Dressing/Incision - clean, dry, no drainage Motor Function - intact, moving foot and toes well on exam.  The CPM machine is working.  Past Medical History:  Diagnosis Date  . BPH without obstruction/lower urinary tract symptoms   . Erectile dysfunction   . GERD (gastroesophageal reflux disease)   . Heart murmur   . History of skin cancer   . HLD (hyperlipidemia)   . Hypertension   . Hypogonadism in male   . Osteoarthritis   . Sleep apnea    CPAP  . Urinary frequency     Assessment/Plan: 1 [place] Post-Op  Procedure(s) (LRB): TOTAL KNEE ARTHROPLASTY (Left) Active Problems:   Status post total knee replacement using cement, left  Estimated body mass index is 34.85 kg/m as calculated from the following:   Height as of this encounter: 5\' 9"  (1.753 m).   Weight as of this encounter: 107 kg. Advance diet Up with therapy D/C IV fluids Discharge home with home health most likely Sunday with possible on Saturday  DVT Prophylaxis -Lovenox and Aspirin, Foot Pumps and TED hose Weight-Bearing as tolerated to left leg  Reche Dixon, PA-C Orthopaedic Surgery 02/28/2020, 6:57 AM

## 2020-02-28 NOTE — Discharge Instructions (Signed)
TOTAL KNEE REPLACEMENT POSTOPERATIVE DIRECTIONS  Knee Rehabilitation, Guidelines Following Surgery  Results after knee surgery are often greatly improved when you follow the exercise, range of motion and muscle strengthening exercises prescribed by your doctor. Safety measures are also important to protect the knee from further injury. Any time any of these exercises cause you to have increased pain or swelling in your knee joint, decrease the amount until you are comfortable again and slowly increase them. If you have problems or questions, call your caregiver or physical therapist for advice.   HOME CARE INSTRUCTIONS  Remove items at home which could result in a fall. This includes throw rugs or furniture in walking pathways.  ICE using the Polar Care unit to the affected knee every three hours for 30 minutes at a time and then as needed for pain and swelling.  Place a dry towel or pillow case over the knee before applying the Polar Care Unit.  Continue to use ice on the knee for pain and swelling from surgery. You may notice swelling that will progress down to the foot and ankle.  This is normal after surgery.  Elevate the leg when you are not up walking on it.   Continue to use the breathing machine which will help keep your temperature down.  It is common for your temperature to cycle up and down following surgery, especially at night when you are not up moving around and exerting yourself.  The breathing machine keeps your lungs expanded and your temperature down. Do not place pillow under knee, focus on keeping the knee straight while resting  DIET You may resume your previous home diet once your are discharged from the hospital.  DRESSING / WOUND CARE / SHOWERING You may change your dressing 3-5 days after surgery.  Then change the dressing every [place] with sterile gauze.  Please use good hand washing techniques before changing the dressing.  Do not use any lotions or creams on the incision  until instructed by your surgeon. You need to keep your wound dry after being discharged home.  Just keep the incision dry and apply a dry gauze dressing on daily. Change the surgical dressing only if needed and reapply a dry dressing each time.  ACTIVITY Walk with your walker as instructed. Use walker as long as suggested by your caregivers. Avoid periods of inactivity such as sitting longer than an hour when not asleep. This helps prevent blood clots.  You may resume a sexual relationship in one month or when given the OK by your doctor.  You may return to work once you are cleared by your doctor.  Do not drive a car for 6 weeks or until released by you surgeon.  Do not drive while taking narcotics.  WEIGHT BEARING Weight bearing as tolerated with assist device (walker, cane, etc) as directed, use it as long as suggested by your surgeon or therapist, typically at least 4-6 weeks.  POSTOPERATIVE CONSTIPATION PROTOCOL Constipation - defined medically as fewer than three stools per week and severe constipation as less than one stool per week.  One of the most common issues patients have following surgery is constipation.  Even if you have a regular bowel pattern at home, your normal regimen is likely to be disrupted due to multiple reasons following surgery.  Combination of anesthesia, postoperative narcotics, change in appetite and fluid intake all can affect your bowels.  In order to avoid complications following surgery, here are some recommendations in order to help   you during your recovery period.  Colace (docusate) - Pick up an over-the-counter form of Colace or another stool softener and take twice a [place] as long as you are requiring postoperative pain medications.  Take with a full glass of water daily.  If you experience loose stools or diarrhea, hold the colace until you stool forms back up.  If your symptoms do not get better within 1 week or if they get worse, check with your  doctor.  Dulcolax (bisacodyl) - Pick up over-the-counter and take as directed by the product packaging as needed to assist with the movement of your bowels.  Take with a full glass of water.  Use this product as needed if not relieved by Colace only.   MiraLax (polyethylene glycol) - Pick up over-the-counter to have on hand.  MiraLax is a solution that will increase the amount of water in your bowels to assist with bowel movements.  Take as directed and can mix with a glass of water, juice, soda, coffee, or tea.  Take if you go more than two days without a movement. Do not use MiraLax more than once per [place]. Call your doctor if you are still constipated or irregular after using this medication for 7 days in a row.  If you continue to have problems with postoperative constipation, please contact the office for further assistance and recommendations.  If you experience "the worst abdominal pain ever" or develop nausea or vomiting, please contact the office immediatly for further recommendations for treatment.  ITCHING  If you experience itching with your medications, try taking only a single pain pill, or even half a pain pill at a time.  You can also use Benadryl over the counter for itching or also to help with sleep.   TED HOSE STOCKINGS Wear the elastic stockings on both legs for six weeks following surgery during the [place] but you may remove then at night for sleeping.  MEDICATIONS See your medication summary on the "After Visit Summary" that the nursing staff will review with you prior to discharge.  You may have some home medications which will be placed on hold until you complete the course of blood thinner medication.  It is important for you to complete the blood thinner medication as prescribed by your surgeon.  Continue your approved medications as instructed at time of discharge.  PRECAUTIONS If you experience chest pain or shortness of breath - call 911 immediately for transfer to the  hospital emergency department.  If you develop a fever greater that 101 F, purulent drainage from wound, increased redness or drainage from wound, foul odor from the wound/dressing, or calf pain - CONTACT YOUR SURGEON.                                                   FOLLOW-UP APPOINTMENTS Make sure you keep all of your appointments after your operation with your surgeon and caregivers. You should call the office at the above phone number and make an appointment for approximately two weeks after the date of your surgery or on the date instructed by your surgeon outlined in the "After Visit Summary".   RANGE OF MOTION AND STRENGTHENING EXERCISES  Rehabilitation of the knee is important following a knee injury or an operation. After just a few days of immobilization, the muscles of the thigh which   control the knee become weakened and shrink (atrophy). Knee exercises are designed to build up the tone and strength of the thigh muscles and to improve knee motion. Often times heat used for twenty to thirty minutes before working out will loosen up your tissues and help with improving the range of motion but do not use heat for the first two weeks following surgery. These exercises can be done on a training (exercise) mat, on the floor, on a table or on a bed. Use what ever works the best and is most comfortable for you Knee exercises include:  Leg Lifts - While your knee is still immobilized in a splint or cast, you can do straight leg raises. Lift the leg to 60 degrees, hold for 3 sec, and slowly lower the leg. Repeat 10-20 times 2-3 times daily. Perform this exercise against resistance later as your knee gets better.  Quad and Hamstring Sets - Tighten up the muscle on the front of the thigh (Quad) and hold for 5-10 sec. Repeat this 10-20 times hourly. Hamstring sets are done by pushing the foot backward against an object and holding for 5-10 sec. Repeat as with quad sets.  Leg Slides: Lying on your back,  slowly slide your foot toward your buttocks, bending your knee up off the floor (only go as far as is comfortable). Then slowly slide your foot back down until your leg is flat on the floor again. Angel Wings: Lying on your back spread your legs to the side as far apart as you can without causing discomfort.  A rehabilitation program following serious knee injuries can speed recovery and prevent re-injury in the future due to weakened muscles. Contact your doctor or a physical therapist for more information on knee rehabilitation.   IF YOU ARE TRANSFERRED TO A SKILLED REHAB FACILITY If the patient is transferred to a skilled rehab facility following release from the hospital, a list of the current medications will be sent to the facility for the patient to continue.  When discharged from the skilled rehab facility, please have the facility set up the patient's Home Health Physical Therapy prior to being released. Also, the skilled facility will be responsible for providing the patient with their medications at time of release from the facility to include their pain medication, the muscle relaxants, and their blood thinner medication. If the patient is still at the rehab facility at time of the two week follow up appointment, the skilled rehab facility will also need to assist the patient in arranging follow up appointment in our office and any transportation needs.  MAKE SURE YOU:  Understand these instructions.  Get help right away if you are not doing well or get worse.    Pick up stool softner and laxative for home use following surgery while on pain medications. Do not submerge incision under water. Please use good hand washing techniques while changing dressing each [place]. May shower starting three days after surgery. Please use a clean towel to pat the incision dry following showers. Continue to use ice for pain and swelling after surgery. Do not use any lotions or creams on the incision until  instructed by your surgeon.  

## 2020-02-28 NOTE — Discharge Summary (Signed)
Physician Discharge Summary  Subjective: 1 [name] Post-Op Procedure(s) (LRB): TOTAL KNEE ARTHROPLASTY (Left) Patient reports pain as moderate.   Patient seen in rounds with Dr. Roland Dean. Patient is well, and has had no acute complaints or problems Patient is ready to go home after PT.  Physician Discharge Summary  Patient ID: Richard Dean MRN: [id number] DOB/AGE: [id number] 70 y.o.  Admit date: 02/27/2020 Discharge date: 02/28/2020  Admission Diagnoses:  Discharge Diagnoses:  Active Problems:   Status post total knee replacement using cement, left   Discharged Condition: fair  Hospital Course: The patient is POD 1 and vitals are stable.  He did PT/OT this morning, and has PT this PM.  He is with good pain control.  He is ready to go home with HHPT.    Treatments: surgery:  Left TKA using all-cemented Biomet Vanguard system with a 67.5 mm mm PCR femur, a 75 mm tibial tray with a 10 mm anterior stabilized E-poly insert, and a 34 x 8.5 mm all-poly 3-pegged domed patella.  Surgeon:   Richard Lux, MD  Assistant:   Richard Dean, RNFA; Richard Hun, PA-S  Anesthesia:   Spinal  Findings:   As above  Complications:   None  EBL:   25 cc  Fluids:   700 cc crystalloid  UOP:   None  TT:   120 minutes at 300 mmHg  Drains:   None  Closure:   Staples  Implants:   As above  Discharge Exam: Blood pressure (!) 163/84, pulse 72, temperature 98.1 F (36.7 C), temperature source Oral, resp. rate 18, height 5\' 9"  (1.753 m), weight 107 kg, SpO2 100 %.   Disposition: Discharge disposition: 01-Home or Self Care        Allergies as of 02/28/2020   No Known Allergies     Medication List    TAKE these medications   amLODipine 5 MG tablet Commonly known as: NORVASC Take 5 mg by mouth at bedtime.   aspirin EC 81 MG tablet Take 81 mg by mouth daily.   BEE POLLEN PO Take 1 tablet by mouth daily.   bumetanide 1 MG tablet Commonly known as: BUMEX Take  1 mg by mouth daily.   donepezil 5 MG tablet Commonly known as: ARICEPT Take 5 mg by mouth at bedtime.   DULoxetine 20 MG capsule Commonly known as: CYMBALTA Take 20 mg by mouth daily.   enoxaparin 40 MG/0.4ML injection Commonly known as: LOVENOX Inject 0.4 mLs (40 mg total) into the skin daily for 14 doses. Start taking on: February 29, 2020   metolazone 2.5 MG tablet Commonly known as: ZAROXOLYN Take 1 tablet by mouth daily as needed.   olmesartan 20 MG tablet Commonly known as: BENICAR Take 20 mg by mouth daily.   oxyCODONE 5 MG immediate release tablet Commonly known as: Oxy IR/ROXICODONE Take 1 tablet (5 mg total) by mouth every 4 (four) hours as needed for moderate pain (pain score 4-6).   pantoprazole 40 MG tablet Commonly known as: PROTONIX Take 40 mg by mouth daily.   potassium chloride 10 MEQ tablet Commonly known as: KLOR-CON Take 10 mEq by mouth 2 (two) times daily.   traMADol 50 MG tablet Commonly known as: ULTRAM Take 1 tablet (50 mg total) by mouth every 6 (six) hours as needed for moderate pain.            Durable Medical Equipment  (From admission, onward)         Start  Ordered   02/27/20 1303  DME Bedside commode  Once       Question:  Patient needs a bedside commode to treat with the following condition  Answer:  Status post total knee replacement using cement, left   02/27/20 1302   02/27/20 1303  DME 3 n 1  Once        02/27/20 1302   02/27/20 1303  DME Walker rolling  Once       Question Answer Comment  Walker: With [place]   Patient needs a walker to treat with the following condition Status post total knee replacement using cement, left      02/27/20 1302          Follow-up Information    Richard Corns, PA-C Follow up in 2 week(s).   Specialty: Physician Assistant Contact information: [place] [address] [phone]               Signed: [place] Dean,  Richard Dean 02/28/2020, 10:48 AM   Objective: Vital signs in last 24 hours: Temp:  [97.2 F (36.2 C)-98.4 F (36.9 C)] 98.1 F (36.7 C) (06/18 0750) Pulse Rate:  [56-83] 72 (06/18 0750) Resp:  [10-19] 18 (06/18 0750) BP: (128-174)/(71-94) 163/84 (06/18 0750) SpO2:  [93 %-100 %] 100 % (06/18 0750) Weight:  [696 kg] 107 kg (06/17 1649)  Intake/Output from previous Richard Dean:  Intake/Output Summary (Last 24 hours) at 02/28/2020 1048 Last data filed at 02/28/2020 1030 Gross per 24 hour  Intake 2137.92 ml  Output 1600 ml  Net 537.92 ml    Intake/Output this shift: Total I/O In: 240 [P.O.:240] Out: 450 [Urine:450]  Labs: Recent Labs    02/28/20 0529  HGB 11.4*   Recent Labs    02/28/20 0529  WBC 6.4  RBC 4.42  HCT 35.1*  PLT 135*   Recent Labs    02/28/20 0529  NA 141  K 4.5  CL 106  CO2 28  BUN 15  CREATININE 1.09  GLUCOSE 122*  CALCIUM 8.4*   No results for input(s): LABPT, INR in the last 72 hours.  EXAM: General - Patient is Alert and Oriented Extremity - Neurovascular intact Sensation intact distally Compartment soft Incision - clean, dry, no drainage Motor Function -  DF/PF intact.    Assessment/Plan: 1 Richard Dean Post-Op Procedure(s) (LRB): TOTAL KNEE ARTHROPLASTY (Left) Procedure(s) (LRB): TOTAL KNEE ARTHROPLASTY (Left) Past Medical History:  Diagnosis Date  . BPH without obstruction/lower urinary tract symptoms   . Erectile dysfunction   . GERD (gastroesophageal reflux disease)   . Heart murmur   . History of skin cancer   . HLD (hyperlipidemia)   . Hypertension   . Hypogonadism in male   . Osteoarthritis   . Sleep apnea    CPAP  . Urinary frequency    Active Problems:   Status post total knee replacement using cement, left  Estimated body mass index is 34.85 kg/m as calculated from the following:   Height as of this encounter: 5\' 9"  (1.753 m).   Weight as of this encounter: 107 kg. Advance diet Up with therapy Discharge home with home  health Diet - Regular diet Follow up - in 2 weeks Activity - WBAT Disposition - Home Condition Upon Discharge - Stable DVT Prophylaxis - Lovenox and TED hose  Richard Dixon, PA-C Orthopaedic Surgery 02/28/2020, 10:48 AM

## 2020-03-11 ENCOUNTER — Encounter: Payer: Managed Care, Other (non HMO) | Admitting: Dermatology

## 2020-05-21 ENCOUNTER — Ambulatory Visit: Payer: Managed Care, Other (non HMO) | Admitting: Dermatology

## 2020-05-21 ENCOUNTER — Other Ambulatory Visit: Payer: Self-pay

## 2020-05-21 DIAGNOSIS — C4441 Basal cell carcinoma of skin of scalp and neck: Secondary | ICD-10-CM | POA: Diagnosis not present

## 2020-05-21 DIAGNOSIS — L820 Inflamed seborrheic keratosis: Secondary | ICD-10-CM

## 2020-05-21 DIAGNOSIS — D492 Neoplasm of unspecified behavior of bone, soft tissue, and skin: Secondary | ICD-10-CM

## 2020-05-21 DIAGNOSIS — C4442 Squamous cell carcinoma of skin of scalp and neck: Secondary | ICD-10-CM

## 2020-05-21 DIAGNOSIS — L570 Actinic keratosis: Secondary | ICD-10-CM | POA: Diagnosis not present

## 2020-05-21 DIAGNOSIS — C4492 Squamous cell carcinoma of skin, unspecified: Secondary | ICD-10-CM

## 2020-05-21 DIAGNOSIS — C4491 Basal cell carcinoma of skin, unspecified: Secondary | ICD-10-CM

## 2020-05-21 DIAGNOSIS — L578 Other skin changes due to chronic exposure to nonionizing radiation: Secondary | ICD-10-CM

## 2020-05-21 DIAGNOSIS — L719 Rosacea, unspecified: Secondary | ICD-10-CM

## 2020-05-21 HISTORY — DX: Squamous cell carcinoma of skin, unspecified: C44.92

## 2020-05-21 HISTORY — DX: Basal cell carcinoma of skin, unspecified: C44.91

## 2020-05-21 MED ORDER — AMBULATORY NON FORMULARY MEDICATION: Status: AC

## 2020-05-21 NOTE — Patient Instructions (Addendum)
Wound Care Instructions  On the [place] following your surgery, you should begin doing daily dressing changes: 1. Remove the old dressing and discard it. 2. Cleanse the wound gently with tap water. This may be done in the shower or by placing a wet gauze pad directly on the wound and letting it soak for several minutes. 3. It is important to gently remove any dried blood from the wound in order to encourage healing. This may be done by gently rolling a moistened Q-tip on the dried blood. Do not pick at the wound. 4. If the wound should start to bleed, continue cleaning the wound, then place a moist gauze pad on the wound and hold pressure for a few minutes.  5. Make sure you then dry the skin surrounding the wound completely or the tape will not stick to the skin. Do not use cotton balls on the wound. 6. After the wound is clean and dry, apply the ointment gently with a Q-tip. 7. Cut a non-stick pad to fit the size of the wound. Lay the pad flush to the wound. If the wound is draining, you may want to reinforce it with a small amount of gauze on top of the non-stick pad for a little added compression to the area. 8. Use the tape to seal the area completely. 9. Select from the following with respect to your individual situation: 1. If your wound has been stitched closed: continue the above steps 1-8 at least daily until your sutures are removed. 2. If your wound has been left open to heal: continue steps 1-8 at least daily for the first 3-4 weeks. 10. We would like for you to take a few extra precautions for at least the next week. 1. Sleep with your head elevated on pillows if our wound is on your head. 2. Do not bend over or lift heavy items to reduce the chance of elevated blood pressure to the wound 3. Do not participate in particularly strenuous activities.   Below is a list of dressing supplies you might need.  . Cotton-tipped applicators - Q-tips . Gauze pads (2x2 and/or 4x4) - All-Purpose  Sponges . Non-stick dressing material - Telfa . Tape - Paper or Hypafix . New and clean tube of petroleum jelly - Vaseline    Comments on Post-Operative Period 1. Slight swelling and redness often appear around the wound. This is normal and will disappear within several days following the surgery. 2. The healing wound will drain a brownish-red-yellow discharge during healing. This is a normal phase of wound healing. As the wound begins to heal, the drainage may increase in amount. Again, this drainage is normal. 3. Notify us if the drainage becomes persistently bloody, excessively swollen, or intensely painful or develops a foul odor or red streaks.  4. If you should experience mild discomfort during the healing phase, you may take an aspirin-free medication such as Tylenol (acetaminophen). Notify us if the discomfort is severe or persistent. Avoid alcoholic beverages when taking pain medicine.  In Case of Wound Hemorrhage A wound hemorrhage is when the bandage suddenly becomes soaked with bright red blood and flows profusely. If this happens, sit down or lie down with your head elevated. If the wound has a dressing on it, do not remove the dressing. Apply pressure to the existing gauze. If the wound is not covered, use a gauze pad to apply pressure and continue applying the pressure for 20 minutes without peeking. DO NOT COVER THE WOUND WITH   A LARGE TOWEL OR [place] CLOTH. Release your hand from the wound site but do not remove the dressing. If the bleeding has stopped, gently clean around the wound. Leave the dressing in place for 24 hours if possible. This wait time allows the blood vessels to close off so that you do not spark a new round of bleeding by disrupting the newly clotted blood vessels with an immediate dressing change. If the bleeding does not subside, continue to hold pressure. If matters are out of your control, contact an After Hours clinic or go to the Emergency Room.  Instructions for  [facility] Medications  One or more of your medications was sent to the [facility] mail order compounding pharmacy. You will receive an email from them and can purchase the medicine through that link. It will then be mailed to your home at the address you confirmed. If for any reason you do not receive an email from them, please check your spam folder. If you still do not find the email, please let us know. [facility] phone number is [phone].

## 2020-05-21 NOTE — Progress Notes (Signed)
Follow-Up Visit   Subjective  Richard Dean is a 69 y.o. male who presents for the following: Skin Problem (Pt c/o growth on the scalp for several months, growing ). He also has history of rosacea and would like to continue treatment. He has other areas to be evaluated.  The following portions of the chart were reviewed this encounter and updated as appropriate:  Tobacco  Allergies  Meds  Problems  Med Hx  Surg Hx  Fam Hx     Review of Systems:  No other skin or systemic complaints except as noted in HPI or Assessment and Plan.  Objective  Well appearing patient in no apparent distress; mood and affect are within normal limits.  A focused examination was performed including face,scalp,ears. Relevant physical exam findings are noted in the Assessment and Plan.  Objective  mid vertex scalp: 1.7 cm hyperkeratotic papule   Objective  Anterior scalp near forehead: 0.7 cm pearly papule   Objective  Head - Anterior (Face): Mid face erythema with telangiectasias +/- scattered inflammatory papules.   Objective  Left Ear mid antihelix: Erythematous keratotic or waxy stuck-on papule or plaque.   Objective  Head - Anterior (Face) (4): Erythematous thin papules/macules with gritty scale.    Assessment & Plan  Neoplasm of skin (2) mid vertex scalp  Epidermal / dermal shaving  Lesion diameter (cm):  1.7 Informed consent: discussed and consent obtained   Timeout: patient name, date of birth, surgical site, and procedure verified   Procedure prep:  Patient was prepped and draped in usual sterile fashion Prep type:  Isopropyl alcohol Anesthesia: the lesion was anesthetized in a standard fashion   Anesthetic:  1% lidocaine w/ epinephrine 1-100,000 buffered w/ 8.4% NaHCO3 Hemostasis achieved with: pressure, aluminum chloride and electrodesiccation   Outcome: patient tolerated procedure well   Post-procedure details: sterile dressing applied and wound care instructions given    Dressing type: bandage and petrolatum    Destruction of lesion Complexity: extensive   Destruction method: electrodesiccation and curettage   Informed consent: discussed and consent obtained   Timeout:  patient name, date of birth, surgical site, and procedure verified Procedure prep:  Patient was prepped and draped in usual sterile fashion Prep type:  Isopropyl alcohol Anesthesia: the lesion was anesthetized in a standard fashion   Anesthetic:  1% lidocaine w/ epinephrine 1-100,000 buffered w/ 8.4% NaHCO3 Curettage performed in three different directions: Yes   Electrodesiccation performed over the curetted area: Yes   Lesion length (cm):  1.7 Lesion width (cm):  1.7 Margin per side (cm):  0.2 Final wound size (cm):  2.1 Hemostasis achieved with:  pressure, aluminum chloride and electrodesiccation Outcome: patient tolerated procedure well with no complications   Post-procedure details: sterile dressing applied and wound care instructions given   Dressing type: bandage and petrolatum    Specimen 1 - Surgical pathology Differential Diagnosis: R/O SCC Check Margins: No 1.7 cm hyperkeratotic papule  Anterior scalp near forehead  Epidermal / dermal shaving  Lesion diameter (cm):  0.7 Informed consent: discussed and consent obtained   Timeout: patient name, date of birth, surgical site, and procedure verified   Procedure prep:  Patient was prepped and draped in usual sterile fashion Prep type:  Isopropyl alcohol Anesthesia: the lesion was anesthetized in a standard fashion   Anesthetic:  1% lidocaine w/ epinephrine 1-100,000 buffered w/ 8.4% NaHCO3 Hemostasis achieved with: pressure, aluminum chloride and electrodesiccation   Outcome: patient tolerated procedure well   Post-procedure details: sterile  dressing applied and wound care instructions given   Dressing type: bandage and petrolatum    Destruction of lesion Complexity: extensive   Destruction method:  electrodesiccation and curettage   Informed consent: discussed and consent obtained   Timeout:  patient name, date of birth, surgical site, and procedure verified Procedure prep:  Patient was prepped and draped in usual sterile fashion Prep type:  Isopropyl alcohol Anesthesia: the lesion was anesthetized in a standard fashion   Anesthetic:  1% lidocaine w/ epinephrine 1-100,000 buffered w/ 8.4% NaHCO3 Curettage performed in three different directions: Yes   Electrodesiccation performed over the curetted area: Yes   Lesion length (cm):  0.7 Lesion width (cm):  0.7 Margin per side (cm):  0.2 Final wound size (cm):  1.1 Hemostasis achieved with:  pressure, aluminum chloride and electrodesiccation Outcome: patient tolerated procedure well with no complications   Post-procedure details: sterile dressing applied and wound care instructions given   Dressing type: bandage and petrolatum    Specimen 2 - Surgical pathology Differential Diagnosis: R/O BCC Check Margins: No 0.7 cm pearly papule  Rosacea Head - Anterior (Face) Start Skin medicinals triple rosacea cream Email address  [email or web address]  Ordered Medications: AMBULATORY NON FORMULARY MEDICATION  Inflamed seborrheic keratosis Left Ear mid antihelix  Destruction of lesion - Left Ear mid antihelix Complexity: simple   Destruction method: cryotherapy   Informed consent: discussed and consent obtained   Timeout:  patient name, date of birth, surgical site, and procedure verified Lesion destroyed using liquid nitrogen: Yes   Region frozen until ice ball extended beyond lesion: Yes   Outcome: patient tolerated procedure well with no complications   Post-procedure details: wound care instructions given    AK (actinic keratosis) (4) Head - Anterior (Face)  Destruction of lesion - Head - Anterior (Face) Complexity: simple   Destruction method: cryotherapy   Informed consent: discussed and consent obtained   Timeout:   patient name, date of birth, surgical site, and procedure verified Lesion destroyed using liquid nitrogen: Yes   Region frozen until ice ball extended beyond lesion: Yes   Outcome: patient tolerated procedure well with no complications   Post-procedure details: wound care instructions given    Actinic Damage - diffuse scaly erythematous macules with underlying dyspigmentation - Recommend daily broad spectrum sunscreen SPF 30+ to sun-exposed areas, reapply every 2 hours as needed.  - Call for new or changing lesions.  Return in about 3 months (around 08/20/2020).  IMarye Round, CMA, am acting as scribe for Sarina Ser, MD .  Documentation: I have reviewed the above documentation for accuracy and completeness, and I agree with the above.  Sarina Ser, MD

## 2020-05-22 ENCOUNTER — Encounter: Payer: Self-pay | Admitting: Dermatology

## 2020-05-28 ENCOUNTER — Telehealth: Payer: Self-pay

## 2020-05-28 NOTE — Telephone Encounter (Signed)
-----   Message from Ralene Bathe, MD sent at 05/28/2020  1:56 PM EDT ----- 1. Skin , mid vertex scalp SQUAMOUS CELL CARCINOMA, KERATOACANTHOMA TYPE 2. Skin , anterior scalp near forehead BASAL CELL CARCINOMA WITH SCLEROSIS  1- Cancer - SCC Already treated 2- Cancer - BCC Already treated Recheck both next visit

## 2020-05-28 NOTE — Telephone Encounter (Signed)
Advised patient of results/hd  

## 2020-08-25 ENCOUNTER — Other Ambulatory Visit: Payer: Self-pay

## 2020-08-25 ENCOUNTER — Ambulatory Visit: Payer: Managed Care, Other (non HMO) | Admitting: Dermatology

## 2020-08-25 DIAGNOSIS — L570 Actinic keratosis: Secondary | ICD-10-CM | POA: Diagnosis not present

## 2020-08-25 DIAGNOSIS — L578 Other skin changes due to chronic exposure to nonionizing radiation: Secondary | ICD-10-CM

## 2020-08-25 DIAGNOSIS — Z85828 Personal history of other malignant neoplasm of skin: Secondary | ICD-10-CM

## 2020-08-25 DIAGNOSIS — L820 Inflamed seborrheic keratosis: Secondary | ICD-10-CM

## 2020-08-25 NOTE — Progress Notes (Signed)
   Follow-Up Visit   Subjective  Richard Dean is a 69 y.o. male who presents for the following: Actinic Keratosis (Follow up - face treated with LN2) and Follow-up (Biopsy proven SCC of mid vertex scalp treated with EDC and Biopsy proven BCC of ant scalp near forehead treated with EDC). Pt c/o irritated spots on his arms he would like removed today   The following portions of the chart were reviewed this encounter and updated as appropriate:   Tobacco  Allergies  Meds  Problems  Med Hx  Surg Hx  Fam Hx     Review of Systems:  No other skin or systemic complaints except as noted in HPI or Assessment and Plan.  Objective  Well appearing patient in no apparent distress; mood and affect are within normal limits.  A focused examination was performed including face,scalp,arms,hands . Relevant physical exam findings are noted in the Assessment and Plan.  Objective  mid vertex scalp: Well healed scar with no evidence of recurrence, no lymphadenopathy.   Objective  ant scalp near forehead: Well healed scar with no evidence of recurrence.   Objective  face,scalp,arm (7): Erythematous thin papules/macules with gritty scale.   Objective  arms (3): Erythematous keratotic or waxy stuck-on papule or plaque.    Assessment & Plan  History of SCC (squamous cell carcinoma) of skin mid vertex scalp  Clear. Observe for recurrence. Call clinic for new or changing lesions.  Recommend regular skin exams, daily broad-spectrum spf 30+ sunscreen use, and photoprotection.     History of basal cell carcinoma (BCC) ant scalp near forehead  Clear. Observe for recurrence. Call clinic for new or changing lesions.  Recommend regular skin exams, daily broad-spectrum spf 30+ sunscreen use, and photoprotection.     AK (actinic keratosis) (7) face,scalp,arm  Destruction of lesion - face,scalp,arm Complexity: simple   Destruction method: cryotherapy   Informed consent: discussed and consent  obtained   Timeout:  patient name, date of birth, surgical site, and procedure verified Lesion destroyed using liquid nitrogen: Yes   Region frozen until ice ball extended beyond lesion: Yes   Outcome: patient tolerated procedure well with no complications   Post-procedure details: wound care instructions given    Inflamed seborrheic keratosis (3) arms  Destruction of lesion - arms Complexity: simple   Destruction method: cryotherapy   Informed consent: discussed and consent obtained   Timeout:  patient name, date of birth, surgical site, and procedure verified Lesion destroyed using liquid nitrogen: Yes   Region frozen until ice ball extended beyond lesion: Yes   Outcome: patient tolerated procedure well with no complications   Post-procedure details: wound care instructions given    Actinic Damage - chronic, secondary to cumulative UV radiation exposure/sun exposure over time - diffuse scaly erythematous macules with underlying dyspigmentation - Recommend daily broad spectrum sunscreen SPF 30+ to sun-exposed areas, reapply every 2 hours as needed.  - Call for new or changing lesions.  Return in about 4 months (around 12/24/2020).  IMarye Round, CMA, am acting as scribe for Sarina Ser, MD .  Documentation: I have reviewed the above documentation for accuracy and completeness, and I agree with the above.  Sarina Ser, MD

## 2020-08-25 NOTE — Patient Instructions (Signed)
Cryotherapy Aftercare  . Wash gently with soap and water everyday.   . Apply Vaseline and Band-Aid daily until healed.  

## 2020-08-30 ENCOUNTER — Encounter: Payer: Self-pay | Admitting: Dermatology

## 2020-09-21 ENCOUNTER — Encounter: Payer: Self-pay | Admitting: Emergency Medicine

## 2020-09-21 ENCOUNTER — Inpatient Hospital Stay
Admission: EM | Admit: 2020-09-21 | Discharge: 2020-09-25 | DRG: 177 | Disposition: A | Discharge status: Home Health | Payer: Managed Care, Other (non HMO) | Attending: Internal Medicine | Admitting: Internal Medicine

## 2020-09-21 ENCOUNTER — Other Ambulatory Visit: Payer: Self-pay

## 2020-09-21 ENCOUNTER — Emergency Department: Payer: Managed Care, Other (non HMO)

## 2020-09-21 ENCOUNTER — Inpatient Hospital Stay: Payer: Managed Care, Other (non HMO)

## 2020-09-21 ENCOUNTER — Emergency Department (HOSPITAL_COMMUNITY)
Admit: 2020-09-21 | Discharge: 2020-09-21 | Disposition: A | Discharge status: Home / Self Care | Payer: Managed Care, Other (non HMO) | Attending: Internal Medicine | Admitting: Internal Medicine

## 2020-09-21 DIAGNOSIS — N529 Male erectile dysfunction, unspecified: Secondary | ICD-10-CM | POA: Diagnosis present

## 2020-09-21 DIAGNOSIS — R600 Localized edema: Secondary | ICD-10-CM | POA: Diagnosis present

## 2020-09-21 DIAGNOSIS — R9431 Abnormal electrocardiogram [ECG] [EKG]: Secondary | ICD-10-CM

## 2020-09-21 DIAGNOSIS — J9601 Acute respiratory failure with hypoxia: Secondary | ICD-10-CM | POA: Diagnosis present

## 2020-09-21 DIAGNOSIS — N400 Enlarged prostate without lower urinary tract symptoms: Secondary | ICD-10-CM | POA: Diagnosis present

## 2020-09-21 DIAGNOSIS — R739 Hyperglycemia, unspecified: Secondary | ICD-10-CM | POA: Diagnosis not present

## 2020-09-21 DIAGNOSIS — E785 Hyperlipidemia, unspecified: Secondary | ICD-10-CM | POA: Diagnosis present

## 2020-09-21 DIAGNOSIS — D638 Anemia in other chronic diseases classified elsewhere: Secondary | ICD-10-CM | POA: Diagnosis present

## 2020-09-21 DIAGNOSIS — Z809 Family history of malignant neoplasm, unspecified: Secondary | ICD-10-CM | POA: Diagnosis not present

## 2020-09-21 DIAGNOSIS — I890 Lymphedema, not elsewhere classified: Secondary | ICD-10-CM | POA: Diagnosis present

## 2020-09-21 DIAGNOSIS — Z87891 Personal history of nicotine dependence: Secondary | ICD-10-CM

## 2020-09-21 DIAGNOSIS — Z79899 Other long term (current) drug therapy: Secondary | ICD-10-CM

## 2020-09-21 DIAGNOSIS — Z96652 Presence of left artificial knee joint: Secondary | ICD-10-CM | POA: Diagnosis present

## 2020-09-21 DIAGNOSIS — Z7982 Long term (current) use of aspirin: Secondary | ICD-10-CM | POA: Diagnosis not present

## 2020-09-21 DIAGNOSIS — A419 Sepsis, unspecified organism: Secondary | ICD-10-CM

## 2020-09-21 DIAGNOSIS — R000 Tachycardia, unspecified: Secondary | ICD-10-CM | POA: Diagnosis present

## 2020-09-21 DIAGNOSIS — I493 Ventricular premature depolarization: Secondary | ICD-10-CM | POA: Diagnosis present

## 2020-09-21 DIAGNOSIS — I498 Other specified cardiac arrhythmias: Secondary | ICD-10-CM

## 2020-09-21 DIAGNOSIS — E7439 Other disorders of intestinal carbohydrate absorption: Secondary | ICD-10-CM | POA: Diagnosis present

## 2020-09-21 DIAGNOSIS — Z85828 Personal history of other malignant neoplasm of skin: Secondary | ICD-10-CM

## 2020-09-21 DIAGNOSIS — I10 Essential (primary) hypertension: Secondary | ICD-10-CM | POA: Diagnosis present

## 2020-09-21 DIAGNOSIS — Z8249 Family history of ischemic heart disease and other diseases of the circulatory system: Secondary | ICD-10-CM

## 2020-09-21 DIAGNOSIS — J1282 Pneumonia due to coronavirus disease 2019: Secondary | ICD-10-CM | POA: Diagnosis present

## 2020-09-21 DIAGNOSIS — T380X5A Adverse effect of glucocorticoids and synthetic analogues, initial encounter: Secondary | ICD-10-CM | POA: Diagnosis not present

## 2020-09-21 DIAGNOSIS — R609 Edema, unspecified: Secondary | ICD-10-CM

## 2020-09-21 DIAGNOSIS — U071 COVID-19: Principal | ICD-10-CM | POA: Diagnosis present

## 2020-09-21 DIAGNOSIS — G4733 Obstructive sleep apnea (adult) (pediatric): Secondary | ICD-10-CM | POA: Diagnosis present

## 2020-09-21 DIAGNOSIS — R008 Other abnormalities of heart beat: Secondary | ICD-10-CM | POA: Diagnosis present

## 2020-09-21 DIAGNOSIS — M1990 Unspecified osteoarthritis, unspecified site: Secondary | ICD-10-CM | POA: Diagnosis present

## 2020-09-21 DIAGNOSIS — K219 Gastro-esophageal reflux disease without esophagitis: Secondary | ICD-10-CM | POA: Diagnosis present

## 2020-09-21 LAB — CBC
HCT: 37.8 % — ABNORMAL LOW (ref 39.0–52.0)
HCT: 35.3 % — ABNORMAL LOW (ref 39.0–52.0)
Hemoglobin: 12.5 g/dL — ABNORMAL LOW (ref 13.0–17.0)
Hemoglobin: 11.2 g/dL — ABNORMAL LOW (ref 13.0–17.0)
MCH: 26.3 pg (ref 26.0–34.0)
MCH: 26 pg (ref 26.0–34.0)
MCHC: 33.1 g/dL (ref 30.0–36.0)
MCHC: 31.7 g/dL (ref 30.0–36.0)
MCV: 79.4 fL — ABNORMAL LOW (ref 80.0–100.0)
MCV: 81.9 fL (ref 80.0–100.0)
Platelets: 210 10*3/uL (ref 150–400)
Platelets: 170 10*3/uL (ref 150–400)
RBC: 4.76 MIL/uL (ref 4.22–5.81)
RBC: 4.31 MIL/uL (ref 4.22–5.81)
RDW: 16.5 % — ABNORMAL HIGH (ref 11.5–15.5)
RDW: 16.4 % — ABNORMAL HIGH (ref 11.5–15.5)
WBC: 12.7 10*3/uL — ABNORMAL HIGH (ref 4.0–10.5)
WBC: 10.1 10*3/uL (ref 4.0–10.5)
nRBC: 0 % (ref 0.0–0.2)
nRBC: 0 % (ref 0.0–0.2)

## 2020-09-21 LAB — TSH: TSH: 3.401 u[IU]/mL (ref 0.350–4.500)

## 2020-09-21 LAB — COMPREHENSIVE METABOLIC PANEL
ALT: 25 U/L (ref 0–44)
ALT: 21 U/L (ref 0–44)
AST: 19 U/L (ref 15–41)
AST: 22 U/L (ref 15–41)
Albumin: 3.9 g/dL (ref 3.5–5.0)
Albumin: 3.5 g/dL (ref 3.5–5.0)
Alkaline Phosphatase: 62 U/L (ref 38–126)
Alkaline Phosphatase: 57 U/L (ref 38–126)
Anion gap: 10 (ref 5–15)
Anion gap: 12 (ref 5–15)
BUN: 22 mg/dL (ref 8–23)
BUN: 20 mg/dL (ref 8–23)
CO2: 26 mmol/L (ref 22–32)
CO2: 25 mmol/L (ref 22–32)
Calcium: 8.9 mg/dL (ref 8.9–10.3)
Calcium: 8.2 mg/dL — ABNORMAL LOW (ref 8.9–10.3)
Chloride: 102 mmol/L (ref 98–111)
Chloride: 101 mmol/L (ref 98–111)
Creatinine, Ser: 1.08 mg/dL (ref 0.61–1.24)
Creatinine, Ser: 1.15 mg/dL (ref 0.61–1.24)
GFR, Estimated: >60 mL/min (ref >60)
GFR, Estimated: >60 mL/min (ref >60)
Glucose, Bld: 124 mg/dL — ABNORMAL HIGH (ref 70–99)
Glucose, Bld: 123 mg/dL — ABNORMAL HIGH (ref 70–99)
Potassium: 4.2 mmol/L (ref 3.5–5.1)
Potassium: 4.2 mmol/L (ref 3.5–5.1)
Sodium: 138 mmol/L (ref 135–145)
Sodium: 138 mmol/L (ref 135–145)
Total Bilirubin: 2 mg/dL — ABNORMAL HIGH (ref 0.3–1.2)
Total Bilirubin: 1.7 mg/dL — ABNORMAL HIGH (ref 0.3–1.2)
Total Protein: 7.3 g/dL (ref 6.5–8.1)
Total Protein: 6.8 g/dL (ref 6.5–8.1)

## 2020-09-21 LAB — TROPONIN I (HIGH SENSITIVITY)
Troponin I (High Sensitivity): 13 ng/L (ref <18)
Troponin I (High Sensitivity): 9 ng/L (ref <18)
Troponin I (High Sensitivity): 11 ng/L (ref <18)
Troponin I (High Sensitivity): 21 ng/L — ABNORMAL HIGH (ref <18)

## 2020-09-21 LAB — LACTATE DEHYDROGENASE: LDH: 193 U/L — ABNORMAL HIGH (ref 98–192)

## 2020-09-21 LAB — IRON AND TIBC
Iron: 15 ug/dL — ABNORMAL LOW (ref 45–182)
Saturation Ratios: 5 % — ABNORMAL LOW (ref 17.9–39.5)
TIBC: 336 ug/dL (ref 250–450)
UIBC: 321 ug/dL

## 2020-09-21 LAB — BASIC METABOLIC PANEL
Anion gap: 14 (ref 5–15)
BUN: 20 mg/dL (ref 8–23)
CO2: 25 mmol/L (ref 22–32)
Calcium: 8.3 mg/dL — ABNORMAL LOW (ref 8.9–10.3)
Chloride: 99 mmol/L (ref 98–111)
Creatinine, Ser: 1.2 mg/dL (ref 0.61–1.24)
GFR, Estimated: >60 mL/min (ref >60)
Glucose, Bld: 97 mg/dL (ref 70–99)
Potassium: 4.1 mmol/L (ref 3.5–5.1)
Sodium: 138 mmol/L (ref 135–145)

## 2020-09-21 LAB — PROCALCITONIN: Procalcitonin: 0.3 ng/mL

## 2020-09-21 LAB — LACTIC ACID, PLASMA: Lactic Acid, Venous: 2 mmol/L (ref 0.5–1.9)

## 2020-09-21 LAB — FERRITIN: Ferritin: 111 ng/mL (ref 24–336)

## 2020-09-21 LAB — MAGNESIUM
Magnesium: 2.2 mg/dL (ref 1.7–2.4)
Magnesium: 2.1 mg/dL (ref 1.7–2.4)

## 2020-09-21 LAB — HEMOGLOBIN A1C
Hgb A1c MFr Bld: 5.5 % (ref 4.8–5.6)
Mean Plasma Glucose: 111.15 mg/dL

## 2020-09-21 LAB — BRAIN NATRIURETIC PEPTIDE: B Natriuretic Peptide: 205.8 pg/mL — ABNORMAL HIGH (ref 0.0–100.0)

## 2020-09-21 LAB — D-DIMER, QUANTITATIVE: D-Dimer, Quant: 0.46 ug/mL-FEU (ref 0.00–0.50)

## 2020-09-21 LAB — PHOSPHORUS: Phosphorus: 3.6 mg/dL (ref 2.5–4.6)

## 2020-09-21 MED ORDER — IOHEXOL 350 MG/ML SOLN
75.0000 mL | Freq: Once | INTRAVENOUS | Status: AC | PRN
  Administered 2020-09-21: 75 mL via INTRAVENOUS

## 2020-09-21 MED ORDER — ONDANSETRON HCL 4 MG/2ML IJ SOLN: 4.0000 mg | Freq: Four times a day (QID) | INTRAMUSCULAR | Status: DC | PRN

## 2020-09-21 MED ORDER — ONDANSETRON HCL 4 MG PO TABS: 4.0000 mg | ORAL_TABLET | Freq: Four times a day (QID) | ORAL | Status: DC | PRN

## 2020-09-21 MED ORDER — ACETAMINOPHEN 500 MG PO TABS: 1000.0000 mg | ORAL_TABLET | Freq: Once | ORAL | Status: DC

## 2020-09-21 MED ORDER — KETOROLAC TROMETHAMINE 30 MG/ML IJ SOLN
15.0000 mg | Freq: Once | INTRAMUSCULAR | Status: AC
  Administered 2020-09-21: 15 mg via INTRAVENOUS
  Filled 2020-09-21: qty 1

## 2020-09-21 MED ORDER — SODIUM CHLORIDE 0.9 % IV SOLN
1.0000 g | Freq: Once | INTRAVENOUS | Status: AC
  Administered 2020-09-21: 1 g via INTRAVENOUS
  Filled 2020-09-21: qty 10

## 2020-09-21 MED ORDER — SODIUM CHLORIDE 0.9 % IV BOLUS (SEPSIS)
500.0000 mL | Freq: Once | INTRAVENOUS | Status: AC
  Administered 2020-09-21: 500 mL via INTRAVENOUS

## 2020-09-21 MED ORDER — AMLODIPINE BESYLATE 5 MG PO TABS
5.0000 mg | ORAL_TABLET | Freq: Every day | ORAL | Status: DC
  Administered 2020-09-21 – 2020-09-24 (×4): 5 mg via ORAL
  Filled 2020-09-21 (×4): qty 1

## 2020-09-21 MED ORDER — ENOXAPARIN SODIUM 60 MG/0.6ML ~~LOC~~ SOLN
0.5000 mg/kg | SUBCUTANEOUS | Status: DC
  Administered 2020-09-21 – 2020-09-24 (×4): 52.5 mg via SUBCUTANEOUS
  Filled 2020-09-21 (×4): qty 0.6

## 2020-09-21 MED ORDER — PANTOPRAZOLE SODIUM 40 MG PO TBEC
40.0000 mg | DELAYED_RELEASE_TABLET | Freq: Every day | ORAL | Status: DC
  Administered 2020-09-21 – 2020-09-25 (×5): 40 mg via ORAL
  Filled 2020-09-21 (×5): qty 1

## 2020-09-21 MED ORDER — SODIUM CHLORIDE 0.9 % IV SOLN
200.0000 mg | Freq: Once | INTRAVENOUS | Status: AC
  Administered 2020-09-21: 200 mg via INTRAVENOUS
  Filled 2020-09-21: qty 200

## 2020-09-21 MED ORDER — ENOXAPARIN SODIUM 40 MG/0.4ML ~~LOC~~ SOLN: 40.0000 mg | SUBCUTANEOUS | Status: DC

## 2020-09-21 MED ORDER — IRBESARTAN 75 MG PO TABS
37.5000 mg | ORAL_TABLET | Freq: Every day | ORAL | Status: DC
  Administered 2020-09-21 – 2020-09-25 (×5): 37.5 mg via ORAL
  Filled 2020-09-21 (×5): qty 0.5

## 2020-09-21 MED ORDER — DEXAMETHASONE 4 MG PO TABS
6.0000 mg | ORAL_TABLET | Freq: Every day | ORAL | Status: DC
  Administered 2020-09-21 – 2020-09-22 (×2): 6 mg via ORAL
  Filled 2020-09-21 (×3): qty 1

## 2020-09-21 MED ORDER — GUAIFENESIN ER 600 MG PO TB12
600.0000 mg | ORAL_TABLET | Freq: Two times a day (BID) | ORAL | Status: DC
  Administered 2020-09-21 – 2020-09-25 (×8): 600 mg via ORAL
  Filled 2020-09-21 (×8): qty 1

## 2020-09-21 MED ORDER — ACETAMINOPHEN 325 MG PO TABS
650.0000 mg | ORAL_TABLET | Freq: Four times a day (QID) | ORAL | Status: DC | PRN
  Administered 2020-09-21: 650 mg via ORAL
  Filled 2020-09-21: qty 2

## 2020-09-21 MED ORDER — DOXYCYCLINE HYCLATE 100 MG PO TABS
100.0000 mg | ORAL_TABLET | Freq: Once | ORAL | Status: AC
  Administered 2020-09-21: 100 mg via ORAL
  Filled 2020-09-21: qty 1

## 2020-09-21 MED ORDER — SODIUM CHLORIDE 0.9 % IV SOLN
100.0000 mg | Freq: Every day | INTRAVENOUS | Status: AC
  Administered 2020-09-22 – 2020-09-25 (×4): 100 mg via INTRAVENOUS
  Filled 2020-09-21 (×4): qty 20

## 2020-09-21 MED ORDER — ASPIRIN EC 81 MG PO TBEC
81.0000 mg | DELAYED_RELEASE_TABLET | Freq: Every day | ORAL | Status: DC
Start: 2020-09-21 — End: 2020-09-25
  Administered 2020-09-21 – 2020-09-25 (×5): 81 mg via ORAL
  Filled 2020-09-21 (×5): qty 1

## 2020-09-21 MED ORDER — ACETAMINOPHEN 650 MG RE SUPP: 650.0000 mg | Freq: Four times a day (QID) | RECTAL | Status: DC | PRN

## 2020-09-21 MED ORDER — ALBUTEROL SULFATE HFA 108 (90 BASE) MCG/ACT IN AERS
2.0000 | INHALATION_SPRAY | RESPIRATORY_TRACT | Status: DC | PRN
  Administered 2020-09-22 – 2020-09-25 (×2): 2 via RESPIRATORY_TRACT
  Filled 2020-09-21: qty 6.7

## 2020-09-21 MED ORDER — SODIUM CHLORIDE 0.9% FLUSH
3.0000 mL | Freq: Two times a day (BID) | INTRAVENOUS | Status: DC
  Administered 2020-09-21 – 2020-09-25 (×8): 3 mL via INTRAVENOUS

## 2020-09-21 MED ORDER — SODIUM CHLORIDE 0.9 % IV BOLUS
500.0000 mL | Freq: Once | INTRAVENOUS | Status: AC
  Administered 2020-09-21: 500 mL via INTRAVENOUS

## 2020-09-21 NOTE — ED Provider Notes (Addendum)
[facility] Emergency Department Provider Note  ____________________________________________   Event Date/Time   First MD Initiated Contact with Patient 09/21/20 1508     (approximate)  I have reviewed the triage vital signs and the nursing notes.   HISTORY  Chief Complaint Cough and Fever    HPI Richard Dean is a 70 y.o. male with hypertension, hyperlipidemia who was COVID-positive on 1/2 who comes in for shortness of breath.  Patient reports oxygen levels dropped to the 80s when ambulating on room air at home.  Concern for abnormal EKG in triage.  Patient was given Tylenol by EMS.  Patient states that today he felt more fatigued and weak than normal.  He stated that he also felt more short of breath.  His symptoms were constant, moderate, nothing made them better, nothing made them worse.  He states that he had a fever for the first time which is why he presented today.  He has no history of abnormal heart problems or history of blood clots.  He was told that he has a murmur only.          Past Medical History:  Diagnosis Date  . Actinic keratosis 07/24/2008   Right sup. temple.   . Basal cell carcinoma 05/21/2020   ant scalp near forehead  . BPH without obstruction/lower urinary tract symptoms   . Dysplastic nevus 01/05/2007   Left ant. shoulder. Moderately severe melanocytic atypia, close to margin. Excised 01/24/2007, margins free.  . Erectile dysfunction   . GERD (gastroesophageal reflux disease)   . Heart murmur   . History of skin cancer   . HLD (hyperlipidemia)   . Hypertension   . Hypogonadism in male   . Osteoarthritis   . Sleep apnea    CPAP  . Squamous cell carcinoma of skin 05/21/2020   mid vertex scalp  . Urinary frequency     Patient Active Problem List   Diagnosis Date Noted  . 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 Sleep apnea 09/09/2016  . GERD (gastroesophageal reflux disease) 05/09/2016   . Anemia 05/09/2016  . Abnormal feces   . Iron deficiency anemia, unspecified   . 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  . Osteoarthritis 11/10/2015  . Essential hypertension 05/05/2015  . Hyperlipidemia 05/05/2015  . BPH with obstruction/lower urinary tract symptoms 03/30/2015    Past Surgical History:  Procedure Laterality Date  . BASAL CELL CARCINOMA EXCISION     Head  . CARPAL TUNNEL RELEASE Right 03/14/2019   Procedure: CARPAL TUNNEL RELEASE ENDOSCOPIC RIGHT;  Surgeon: Corky Mull, MD;  Location: [facility];  Service: Orthopedics;  Laterality: Right;  . circumscision    . COLONOSCOPY WITH PROPOFOL N/A 12/10/2015   Procedure: COLONOSCOPY WITH PROPOFOL;  Surgeon: Lucilla Lame, MD;  Location: [facility];  Service: Endoscopy;  Laterality: N/A;  . spider bite Left arm   major reconstructive surgery for gangrene  . TOTAL KNEE ARTHROPLASTY Left 02/27/2020   Procedure: TOTAL KNEE ARTHROPLASTY;  Surgeon: Corky Mull, MD;  Location: [facility];  Service: Orthopedics;  Laterality: Left;    Prior to Admission medications   Medication Sig Start Date End Date Taking? Authorizing Provider  AMBULATORY NON FORMULARY MEDICATION Medication Name: Azelaic Acid 15%, Metronidazole 1%, Ivermectin 1% Cream ([employer]) 05/21/20   Ralene Bathe, MD  amLODipine (NORVASC) 5 MG tablet Take 5 mg by mouth at bedtime.    [provider]  aspirin EC 81  MG tablet Take 81 mg by mouth daily.    [provider]  BEE POLLEN PO Take 1 tablet by mouth daily.     [provider]  bumetanide (BUMEX) 1 MG tablet Take 1 mg by mouth daily.  10/31/16 02/13/20  [provider]  donepezil (ARICEPT) 5 MG tablet Take 5 mg by mouth at bedtime.    [provider]  DULoxetine (CYMBALTA) 20 MG capsule Take 20 mg by mouth daily. Patient not taking: Reported on 02/27/2020    [provider]  enoxaparin (LOVENOX) 40 MG/0.4ML injection Inject 0.4 mLs (40 mg  total) into the skin daily for 14 doses. 02/29/20 03/14/20  Reche Dixon, PA-C  metolazone (ZAROXOLYN) 2.5 MG tablet Take 1 tablet by mouth daily as needed. 02/28/17 02/27/20  [provider]  olmesartan (BENICAR) 20 MG tablet Take 20 mg by mouth daily.    [provider]  oxyCODONE (OXY IR/ROXICODONE) 5 MG immediate release tablet Take 1 tablet (5 mg total) by mouth every 4 (four) hours as needed for moderate pain (pain score 4-6). 02/28/20   Reche Dixon, PA-C  pantoprazole (PROTONIX) 40 MG tablet Take 40 mg by mouth daily.  10/31/16 02/13/20  [provider]  potassium chloride (K-DUR) 10 MEQ tablet Take 10 mEq by mouth 2 (two) times daily.     [provider]  traMADol (ULTRAM) 50 MG tablet Take 1 tablet (50 mg total) by mouth every 6 (six) hours as needed for moderate pain. 02/28/20   Reche Dixon, PA-C    Allergies Patient has no known allergies.  Family History  Problem Relation Age of Onset  . Hypertension Mother   . Cancer Father        lung  . Heart disease Father        MI  . Hypertension Brother   . Allergies Daughter   . Hypertension Son   . Thyroid disease Son   . Hypertension Maternal Grandmother   . Hypertension Maternal Grandfather   . Hypertension Brother   . Kidney disease Neg Hx   . Prostate cancer Neg Hx     Social History Social History   Tobacco Use  . Smoking status: Former Smoker    Types: Cigarettes    Quit date: 03/30/1995    Years since quitting: 25.4  . Smokeless tobacco: Never Used  Vaping Use  . Vaping Use: Never used  Substance Use Topics  . Alcohol use: No    Alcohol/week: 0.0 standard drinks  . Drug use: No      Review of Systems Constitutional: Weakness, fever Eyes: No visual changes. ENT: No sore throat. Cardiovascular: No chest pain Respiratory: Positive for SOB, cough Gastrointestinal: No abdominal pain.  No nausea, no vomiting.  No diarrhea.  No constipation. Genitourinary: Negative for  dysuria. Musculoskeletal: Negative for back pain. Skin: Negative for rash. Neurological: Negative for headaches, focal weakness or numbness. All other ROS negative ____________________________________________   PHYSICAL EXAM:  VITAL SIGNS: ED Triage Vitals  Enc Vitals Group     BP 09/21/20 1045 (!) 143/63     Pulse Rate 09/21/20 1045 (!) 56     Resp 09/21/20 1045 (!) 24     Temp 09/21/20 1045 100.3 F (37.9 C)     Temp Source 09/21/20 1045 Oral     SpO2 09/21/20 1045 95 %     Weight 09/21/20 1055 230 lb (104.3 kg)     Height 09/21/20 1055 5\' 9"  (1.753 m)  Head Circumference --      Peak Flow --      Pain Score 09/21/20 1055 0     Pain Loc --      Pain Edu? --      Excl. in [place]? --     Constitutional: Alert and oriented. Well appearing and in no acute distress. Eyes: Conjunctivae are normal. EOMI. Head: Atraumatic. Nose: No congestion/rhinnorhea. Mouth/Throat: Mucous membranes are moist.   Neck: No stridor. Trachea Midline. FROM Cardiovascular: Normal rate, regular rhythm. Grossly normal heart sounds.  Good peripheral circulation. Respiratory:  Gastrointestinal: Soft and nontender. No distention. No abdominal bruits.  Musculoskeletal: No lower extremity tenderness nor edema.  No joint effusions. Neurologic:  Normal speech and language. No gross focal neurologic deficits are appreciated.  Skin:  Skin is warm, dry and intact. No rash noted. Psychiatric: Mood and affect are normal. Speech and behavior are normal. GU: Deferred   ____________________________________________   LABS (all labs ordered are listed, but only abnormal results are displayed)  Labs Reviewed  CBC - Abnormal; Notable for the following components:      Result Value   WBC 12.7 (*)    Hemoglobin 12.5 (*)    HCT 37.8 (*)    MCV 79.4 (*)    RDW 16.5 (*)    All other components within normal limits  COMPREHENSIVE METABOLIC PANEL - Abnormal; Notable for the following components:   Glucose, Bld  124 (*)    Total Bilirubin 2.0 (*)    All other components within normal limits  MAGNESIUM  TROPONIN I (HIGH SENSITIVITY)  TROPONIN I (HIGH SENSITIVITY)   ____________________________________________   ED ECG REPORT I, Vanessa , the attending physician, personally viewed and interpreted this ECG.  EKG is sinus tachycardia rate of 116 with bigeminy noted no ST elevation no T wave inversions normal intervals otherwise ____________________________________________  RADIOLOGY Robert Bellow, personally viewed and evaluated these images (plain radiographs) as part of my medical decision making, as well as reviewing the written report by the radiologist.  ED MD interpretation: Streaky opacities bilaterally consistent with [place]  Official radiology report(s): DG Chest 2 View  Result Date: 09/21/2020 CLINICAL DATA:  Cough, shortness of breath, COVID-19 positive EXAM: CHEST - 2 VIEW COMPARISON:  None. FINDINGS: The heart size and mediastinal contours are within normal limits. Low lung volumes. Streaky interstitial opacities within the mid to lower lung fields bilaterally. No pleural effusion or pneumothorax. Degenerative changes of the shoulders and spine. IMPRESSION: Streaky interstitial opacities within the mid to lower lung fields bilaterally. Findings suspicious for atypical/viral infection. Electronically Signed   By: Davina Poke D.O.   On: 09/21/2020 11:40   CT Angio Chest PE W and/or Wo Contrast  Result Date: 09/21/2020 CLINICAL DATA:  Tested COVID positive on 09/13/2020. Today woke up with shortness of breath, oxygen saturation in the 80s when ambulating on room air, trigeminy on monitor EXAM: CT ANGIOGRAPHY CHEST WITH CONTRAST TECHNIQUE: Multidetector CT imaging of the chest was performed using the standard protocol during bolus administration of intravenous contrast. Multiplanar CT image reconstructions and MIPs were obtained to evaluate the vascular anatomy. CONTRAST:  81mL  OMNIPAQUE IOHEXOL 350 MG/ML SOLN IV COMPARISON:  None FINDINGS: Cardiovascular: Atherosclerotic calcifications aorta and coronary arteries. Aorta normal caliber without aneurysm or dissection. Heart unremarkable. No pericardial effusion. Pulmonary arteries adequately opacified and patent. No evidence of pulmonary embolism. Mediastinum/Nodes: Base of cervical region normal appearance. Esophagus unremarkable. Question tiny hiatal hernia. No thoracic adenopathy. Lungs/Pleura: Patchy  BILATERAL pulmonary infiltrates consistent with multifocal pneumonia and history of COVID-19. No pleural effusion or pneumothorax. Upper Abdomen: Fatty infiltration of liver. Spleen appears mildly enlarged at measuring 14.6 x 6.1 x >11.2 cm (volume = 520+ cm^3) Musculoskeletal: No acute osseous findings. Review of the MIP images confirms the above findings. IMPRESSION: No evidence of pulmonary embolism. Patchy BILATERAL pulmonary infiltrates consistent with multifocal pneumonia and history of COVID-19. Mild splenomegaly and fatty infiltration of liver. Aortic Atherosclerosis (ICD10-I70.0). Electronically Signed   By: Lavonia Dana M.D.   On: 09/21/2020 16:26    ____________________________________________   PROCEDURES  Procedure(s) performed (including Critical Care):  .1-3 Lead EKG Interpretation Performed by: Vanessa [place], MD Authorized by: Vanessa [place], MD     Interpretation: abnormal     ECG rate:  60-110s    ECG rate assessment: tachycardic     Rhythm: sinus rhythm     Ectopy: bigeminy     Conduction: normal    .Critical Care Performed by: Vanessa [place], MD Authorized by: Vanessa [place], MD   Critical care provider statement:    Critical care time (minutes):  35   Critical care was necessary to treat or prevent imminent or life-threatening deterioration of the following conditions:  Sepsis   Critical care was time spent personally by me on the following activities:  Discussions with consultants, evaluation of  patient's response to treatment, examination of patient, ordering and performing treatments and interventions, ordering and review of laboratory studies, ordering and review of radiographic studies, pulse oximetry, re-evaluation of patient's condition, obtaining history from patient or surrogate and review of old charts     ____________________________________________   INITIAL IMPRESSION / [place] / ED COURSE   Richard Dean was evaluated in Emergency Department on 09/21/2020 for the symptoms described in the history of present illness. He was evaluated in the context of the global COVID-19 pandemic, which necessitated consideration that the patient might be at risk for infection with the SARS-CoV-2 virus that causes COVID-19. Institutional protocols and algorithms that pertain to the evaluation of patients at risk for COVID-19 are in a state of rapid change based on information released by regulatory bodies including the CDC and federal and state organizations. These policies and algorithms were followed during the patient's care in the ED.     Pt presents with SOB.  Suspect this is most likely secondary to his COVID although given his new bigeminy noted on cardiac monitor we will proceed with CT PE.  Patient is also over a week out and continues to be significantly dyspneic.   Differential includes: PNA-will get xray to evaluation Anemia-CBC to evaluate ACS- will get trops Arrhythmia-Will get EKG and keep on monitor.     4:31 PM PE scan negative.  Cardiac markers are negative.  Given patient's significant work of breathing with desaturations down to the 90s with ambulation and patient's age and comorbidities I think it be best to admit patient to the observation unit for continued monitoring  We will treat patient for possible superimposed bacterial pneumonia while procalcitonin is pending.  Procalcitonin was elevated.  Given patient's significant work of breathing with  desaturation down to the 90s had increased respiratory rate with ambulating in the setting of new bigeminy we will discussed the hospital team for admission   5:46 PM Patient now has heart rates going up into the 130s 140s.  Given this patient is now meeting sepsis criteria.  I suspect this is still most likely  secondary to COVID versus a COVID-pneumonia.  Added on blood cultures and lactate.  Will give another 500 cc of fluid but do not want to fluid overload him given his age.  He is not hypotensive and has a normal lactate.       Clinical Course as of 09/21/20 1715  Mon Sep 21, 2020  1715 Procalcitonin - Baseline [MF]    Clinical Course User Index [MF] Vanessa [place], MD     ____________________________________________   FINAL CLINICAL IMPRESSION(S) / ED DIAGNOSES   Final diagnoses:  [id number]  Ventricular bigeminy  Pneumonia due to COVID-19 virus  Sepsis, due to unspecified organism, unspecified whether acute organ dysfunction present ([place])     MEDICATIONS GIVEN DURING THIS VISIT:  Medications  albuterol (VENTOLIN HFA) 108 (90 Base) MCG/ACT inhaler 2 puff (has no administration in time range)  sodium chloride 0.9 % bolus 500 mL (has no administration in time range)  ketorolac (TORADOL) 30 MG/ML injection 15 mg (has no administration in time range)  sodium chloride 0.9 % bolus 500 mL (0 mLs Intravenous Stopped 09/21/20 1746)  iohexol (OMNIPAQUE) 350 MG/ML injection 75 mL (75 mLs Intravenous Contrast Given 09/21/20 1606)  cefTRIAXone (ROCEPHIN) 1 g in sodium chloride 0.9 % 100 mL IVPB (0 g Intravenous Stopped 09/21/20 1746)  doxycycline (VIBRA-TABS) tablet 100 mg (100 mg Oral Given 09/21/20 1701)     ED Discharge Orders    None       Note:  This document was prepared using Dragon voice recognition software and may include unintentional dictation errors.   Vanessa [place], MD 09/21/20 1707    Vanessa [place], MD 09/21/20 [phone]

## 2020-09-21 NOTE — ED Triage Notes (Signed)
First Nurse Note:  Arrives via [facility].  Patient tested COVID positive 1/2. Today woke up and felt SOB.  Per report sats dropped into the 80's when ambulating on RA at home.  On monitor, patient was noted to be in trigeminy asymptomatic to rhythm.

## 2020-09-21 NOTE — ED Notes (Signed)
Pt states that he called 911 because he had a fever of 101.74f. Pt states he was given tylenol by EMS

## 2020-09-21 NOTE — Progress Notes (Signed)
Remdesivir - Pharmacy Brief Note   O:  ALT: CXR:   Bilateral opacities  SpO2: 80 % on RA    A/P:  Remdesivir 200 mg IVPB once followed by 100 mg IVPB daily x 4 days.   Richard Dean D   09/21/2020 8:25 PM

## 2020-09-21 NOTE — ED Notes (Signed)
US at bedside

## 2020-09-21 NOTE — ED Triage Notes (Signed)
See first RN Note; pt states hasn't had a fever until today. Pt states SOB and cough. Pt noted to be in vent bigeminy on EKG.

## 2020-09-21 NOTE — Progress Notes (Signed)
*  PRELIMINARY RESULTS* Echocardiogram 2D Echocardiogram has been performed.  Richard Dean 09/21/2020, 7:37 PM

## 2020-09-21 NOTE — H&P (Addendum)
History and Physical    Richard Dean ZOX:[id number] DOB: July 20, 1951 DOA: 09/21/2020  PCP: Danella Penton, MD  Patient coming from: home  I have personally briefly reviewed patient's old medical records in [facility]  Chief Complaint: increase fever, cough, sob   HPI: Richard Dean is a 70 y.o. male with medical history significant for HTN,BPH,GERD,HLD,OSA on CPAP who has interim history of diagnosis of COVID on home test 09/17/19 after having symptoms for 5 days of cough, fever/chills, as well as doe, nasal congestion and pressure similar to sinus infection. He contacted his primary care who wrote prescription for z-pak and prednisone x 10 days.  Patient states he has increase sob/doe today along with increasing fever and cough as well as intermittent confusion. Due to this patient presented to ed for further evaluation. In ed patient was noted to have stable sat on initial evaluation,with tachycardia to 1 teens. EKG/tele sinus tachycardia with increase pvc's. However with ambulation patient had desat to 90's with increase work of breathing. Patient course further complicated by another episode increase hr to 130-140' sinus with increase pvc's.  ED Course: Temp 100.3, bp 143/63, hr 56, rr 24, rr 116, sat 98%  Labs:  Wbc 12.7, hgb 12.5( at baseline ) mcv 79.4 Na 138, k 4.2, cr 1.08 at baseline , ast 19, alt 25, ce 13,9, Procal:0.3 Mag 2.2 Lactic:2 Mixed venous gas: 7.48,pco2 40, po2,35 Cxr: Streaky interstitial opacities within the mid to lower lung fields bilaterally. Findings suspicious for atypical/viral infection.  tx in ed with doxcycline,  And CTX, Review of Systems: As per HPI otherwise 10 point review of systems negative.   Past Medical History:  Diagnosis Date  . Actinic keratosis 07/24/2008   Right sup. temple.   . Basal cell carcinoma 05/21/2020   ant scalp near forehead  . BPH without obstruction/lower urinary tract symptoms   . Dysplastic nevus 01/05/2007   Left  ant. shoulder. Moderately severe melanocytic atypia, close to margin. Excised 01/24/2007, margins free.  . Erectile dysfunction   . GERD (gastroesophageal reflux disease)   . Heart murmur   . History of skin cancer   . HLD (hyperlipidemia)   . Hypertension   . Hypogonadism in male   . Osteoarthritis   . Sleep apnea    CPAP  . Squamous cell carcinoma of skin 05/21/2020   mid vertex scalp  . Urinary frequency     Past Surgical History:  Procedure Laterality Date  . BASAL CELL CARCINOMA EXCISION     Head  . CARPAL TUNNEL RELEASE Right 03/14/2019   Procedure: CARPAL TUNNEL RELEASE ENDOSCOPIC RIGHT;  Surgeon: Christena Flake, MD;  Location: [facility];  Service: Orthopedics;  Laterality: Right;  . circumscision    . COLONOSCOPY WITH PROPOFOL N/A 12/10/2015   Procedure: COLONOSCOPY WITH PROPOFOL;  Surgeon: Midge Minium, MD;  Location: [facility];  Service: Endoscopy;  Laterality: N/A;  . spider bite Left arm   major reconstructive surgery for gangrene  . TOTAL KNEE ARTHROPLASTY Left 02/27/2020   Procedure: TOTAL KNEE ARTHROPLASTY;  Surgeon: Christena Flake, MD;  Location: [facility];  Service: Orthopedics;  Laterality: Left;     reports that he quit smoking about 25 years ago. His smoking use included cigarettes. He has never used smokeless tobacco. He reports that he does not drink alcohol and does not use drugs.  No Known Allergies  Family History  Problem Relation Age of Onset  . Hypertension Mother   . Cancer  Father        lung  . Heart disease Father        MI  . Hypertension Brother   . Allergies Daughter   . Hypertension Son   . Thyroid disease Son   . Hypertension Maternal Grandmother   . Hypertension Maternal Grandfather   . Hypertension Brother   . Kidney disease Neg Hx   . Prostate cancer Neg Hx     Prior to Admission medications   Medication Sig Start Date End Date Taking? Authorizing Provider  AMBULATORY NON FORMULARY MEDICATION Medication Name: Azelaic Acid  15%, Metronidazole 1%, Ivermectin 1% Cream ([facility] [facility]) 05/21/20   Deirdre Evener, MD  amLODipine (NORVASC) 5 MG tablet Take 5 mg by mouth at bedtime.    [provider]  aspirin EC 81 MG tablet Take 81 mg by mouth daily.    [provider]  BEE POLLEN PO Take 1 tablet by mouth daily.     [provider]  bumetanide (BUMEX) 1 MG tablet Take 1 mg by mouth daily.  10/31/16 02/13/20  [provider]  donepezil (ARICEPT) 5 MG tablet Take 5 mg by mouth at bedtime.    [provider]  DULoxetine (CYMBALTA) 20 MG capsule Take 20 mg by mouth daily. Patient not taking: Reported on 02/27/2020    [provider]  enoxaparin (LOVENOX) 40 MG/0.4ML injection Inject 0.4 mLs (40 mg total) into the skin daily for 14 doses. 02/29/20 03/14/20  Dedra Skeens, PA-C  metolazone (ZAROXOLYN) 2.5 MG tablet Take 1 tablet by mouth daily as needed. 02/28/17 02/27/20  [provider]  olmesartan (BENICAR) 20 MG tablet Take 20 mg by mouth daily.    [provider]  oxyCODONE (OXY IR/ROXICODONE) 5 MG immediate release tablet Take 1 tablet (5 mg total) by mouth every 4 (four) hours as needed for moderate pain (pain score 4-6). 02/28/20   Dedra Skeens, PA-C  pantoprazole (PROTONIX) 40 MG tablet Take 40 mg by mouth daily.  10/31/16 02/13/20  [provider]  potassium chloride (K-DUR) 10 MEQ tablet Take 10 mEq by mouth 2 (two) times daily.     [provider]  traMADol (ULTRAM) 50 MG tablet Take 1 tablet (50 mg total) by mouth every 6 (six) hours as needed for moderate pain. 02/28/20   Dedra Skeens, PA-C    Physical Exam: Vitals:   09/21/20 1511 09/21/20 1600 09/21/20 1700 09/21/20 1751  BP: 139/70 (!) 145/93 (!) 172/80   Pulse: 88 65 (!) 35   Resp: (!) 22 (!) 23 (!) 22   Temp:    99.6 F (37.6 C)  TempSrc:    Oral  SpO2: 98% 97% 98%   Weight:      Height:        Constitutional: NAD, calm, comfortable Vitals:   09/21/20 1511 09/21/20 1600  09/21/20 1700 09/21/20 1751  BP: 139/70 (!) 145/93 (!) 172/80   Pulse: 88 65 (!) 35   Resp: (!) 22 (!) 23 (!) 22   Temp:    99.6 F (37.6 C)  TempSrc:    Oral  SpO2: 98% 97% 98%   Weight:      Height:       Eyes: PERRL, lids and conjunctivae normal ENMT: Mucous membranes are moist. Posterior pharynx clear of any exudate or lesions.Normal dentition.  Neck: normal, supple, no masses, no thyromegaly Respiratory: clear to auscultation bilaterally, no wheezing, no crackles. Normal respiratory effort. No accessory muscle use.  Cardiovascular: Regular  rate and rhythm, no murmurs / rubs / gallops. No extremity edema. 2+ pedal pulses. No carotid bruits.  Abdomen: no tenderness, no masses palpated. No hepatosplenomegaly. Bowel sounds positive.  Musculoskeletal: no clubbing / cyanosis. No joint deformity upper and lower extremities. Good ROM, no contractures. Normal muscle tone.  Skin: no rashes, lesions, ulcers. No induration Neurologic: CN 2-12 grossly intact. Sensation intact. Strength 5/5 in all 4.  Psychiatric: Normal judgment and insight. Alert and oriented x 3. Normal mood.    Labs on Admission: I have personally reviewed following labs and imaging studies  CBC: Recent Labs  Lab 09/21/20 1048  WBC 12.7*  HGB 12.5*  HCT 37.8*  MCV 79.4*  PLT 210   Basic Metabolic Panel: Recent Labs  Lab 09/21/20 1048  NA 138  K 4.2  CL 102  CO2 26  GLUCOSE 124*  BUN 22  CREATININE 1.08  CALCIUM 8.9  MG 2.2   GFR: Estimated Creatinine Clearance: 75.7 mL/min (by C-G formula based on SCr of 1.08 mg/dL). Liver Function Tests: Recent Labs  Lab 09/21/20 1048  AST 19  ALT 25  ALKPHOS 62  BILITOT 2.0*  PROT 7.3  ALBUMIN 3.9   No results for input(s): LIPASE, AMYLASE in the last 168 hours. No results for input(s): AMMONIA in the last 168 hours. Coagulation Profile: No results for input(s): INR, PROTIME in the last 168 hours. Cardiac Enzymes: No results for input(s): CKTOTAL, CKMB,  CKMBINDEX, TROPONINI in the last 168 hours. BNP (last 3 results) No results for input(s): PROBNP in the last 8760 hours. HbA1C: No results for input(s): HGBA1C in the last 72 hours. CBG: No results for input(s): GLUCAP in the last 168 hours. Lipid Profile: No results for input(s): CHOL, HDL, LDLCALC, TRIG, CHOLHDL, LDLDIRECT in the last 72 hours. Thyroid Function Tests: No results for input(s): TSH, T4TOTAL, FREET4, T3FREE, THYROIDAB in the last 72 hours. Anemia Panel: No results for input(s): VITAMINB12, FOLATE, FERRITIN, TIBC, IRON, RETICCTPCT in the last 72 hours. Urine analysis:    Component Value Date/Time   COLORURINE YELLOW (A) 02/19/2020 1513   APPEARANCEUR HAZY (A) 02/19/2020 1513   LABSPEC 1.027 02/19/2020 1513   PHURINE 5.0 02/19/2020 1513   GLUCOSEU NEGATIVE 02/19/2020 1513   HGBUR NEGATIVE 02/19/2020 1513   BILIRUBINUR NEGATIVE 02/19/2020 1513   KETONESUR NEGATIVE 02/19/2020 1513   PROTEINUR NEGATIVE 02/19/2020 1513   NITRITE NEGATIVE 02/19/2020 1513   LEUKOCYTESUR NEGATIVE 02/19/2020 1513    Radiological Exams on Admission: DG Chest 2 View  Result Date: 09/21/2020 CLINICAL DATA:  Cough, shortness of breath, COVID-19 positive EXAM: CHEST - 2 VIEW COMPARISON:  None. FINDINGS: The heart size and mediastinal contours are within normal limits. Low lung volumes. Streaky interstitial opacities within the mid to lower lung fields bilaterally. No pleural effusion or pneumothorax. Degenerative changes of the shoulders and spine. IMPRESSION: Streaky interstitial opacities within the mid to lower lung fields bilaterally. Findings suspicious for atypical/viral infection. Electronically Signed   By: Duanne Guess D.O.   On: 09/21/2020 11:40   CT Angio Chest PE W and/or Wo Contrast  Result Date: 09/21/2020 CLINICAL DATA:  Tested COVID positive on 09/13/2020. Today woke up with shortness of breath, oxygen saturation in the 80s when ambulating on room air, trigeminy on monitor  EXAM: CT ANGIOGRAPHY CHEST WITH CONTRAST TECHNIQUE: Multidetector CT imaging of the chest was performed using the standard protocol during bolus administration of intravenous contrast. Multiplanar CT image reconstructions and MIPs were obtained to evaluate the vascular anatomy. CONTRAST:  75mL OMNIPAQUE IOHEXOL 350 MG/ML SOLN IV COMPARISON:  None FINDINGS: Cardiovascular: Atherosclerotic calcifications aorta and coronary arteries. Aorta normal caliber without aneurysm or dissection. Heart unremarkable. No pericardial effusion. Pulmonary arteries adequately opacified and patent. No evidence of pulmonary embolism. Mediastinum/Nodes: Base of cervical region normal appearance. Esophagus unremarkable. Question tiny hiatal hernia. No thoracic adenopathy. Lungs/Pleura: Patchy BILATERAL pulmonary infiltrates consistent with multifocal pneumonia and history of COVID-19. No pleural effusion or pneumothorax. Upper Abdomen: Fatty infiltration of liver. Spleen appears mildly enlarged at measuring 14.6 x 6.1 x >11.2 cm (volume = 520+ cm^3) Musculoskeletal: No acute osseous findings. Review of the MIP images confirms the above findings. IMPRESSION: No evidence of pulmonary embolism. Patchy BILATERAL pulmonary infiltrates consistent with multifocal pneumonia and history of COVID-19. Mild splenomegaly and fatty infiltration of liver. Aortic Atherosclerosis (ICD10-I70.0). Electronically Signed   By: Ulyses Southward M.D.   On: 09/21/2020 16:26    EKG: Independently reviewed. See above  Assessment/Plan COVID viral Pneumonia with associated hypoxic respiratory failure  -decadron 6mg  po x 10 days  - remdesivir x 5 days -patient currently with low O2 requirement and is not a candidate for tocilizumab at time,will continue re-evaluate need based on patient course  -self prone as able  -judicious ivfs  -encourage po fluid intake  -daily monitoring of COVID severity labs -CTPA:negative for Pe -dvt ppx per protocol     Sinus  tachycardia with frequent pvc  -electrolytes within normal limit -patient denies cardiac disease, but mentions hx of murmur  -ekg no hyperacute st-t wave changes , ce negative  -patient note chest pressure he attributes to breathing -he denies history of CHF, no noted findings of this diagnosis in his chart  -to be complete will evaluate cardiac function with echo cardiogram  -will also check lipid panel -sinus tachycardia /pvc related to acute infection related to covid with underlying hypoxemia  -continue with O2 supplementation  -mixed venous gas noted,will reorder abg for am  Anemia  -chronic low mcv -will check anemia panel   HTN -patient continues at his baseline hypertensive state  -will resume Benicar and amlodipine  -will hold diuretic for now   Chronic lower extremity edema 10/11/2016  -due to lymphedema -controlled with bumetanide,  Hyperglycemia/glucose intolerance -check A1C -place on fs  BPH Not currently on treatment  No active issues  GERD -continue PPI  HLD -diet controlled   OSA - on CPAP qhs   DVT prophylaxis:lovenox  Code Status:Full Family Communication: n/a, family not at bedside  Disposition Plan:patient  expected to be admitted greater than 2 midnights tConsults called: n/a  Admission status: inpatient    Lurline Del MD [facility]  If 7PM-7AM, please contact night-coverage [email or web address] Password [facility]  09/21/2020, 5:56 PM

## 2020-09-22 DIAGNOSIS — J9601 Acute respiratory failure with hypoxia: Secondary | ICD-10-CM

## 2020-09-22 DIAGNOSIS — J1282 Pneumonia due to coronavirus disease 2019: Secondary | ICD-10-CM | POA: Diagnosis not present

## 2020-09-22 DIAGNOSIS — U071 COVID-19: Principal | ICD-10-CM

## 2020-09-22 LAB — BLOOD GAS, ARTERIAL
Acid-Base Excess: 0.6 mmol/L (ref 0.0–2.0)
Bicarbonate: 24.3 mmol/L (ref 20.0–28.0)
FIO2: 0.24
O2 Saturation: 95.5 %
Patient temperature: 37
pCO2 arterial: 35 mmHg (ref 32.0–48.0)
pH, Arterial: 7.45 (ref 7.350–7.450)
pO2, Arterial: 75 mmHg — ABNORMAL LOW (ref 83.0–108.0)

## 2020-09-22 LAB — CBC
HCT: 38.7 % — ABNORMAL LOW (ref 39.0–52.0)
Hemoglobin: 12.5 g/dL — ABNORMAL LOW (ref 13.0–17.0)
MCH: 26.2 pg (ref 26.0–34.0)
MCHC: 32.3 g/dL (ref 30.0–36.0)
MCV: 81 fL (ref 80.0–100.0)
Platelets: 176 10*3/uL (ref 150–400)
RBC: 4.78 MIL/uL (ref 4.22–5.81)
RDW: 16.1 % — ABNORMAL HIGH (ref 11.5–15.5)
WBC: 11.2 10*3/uL — ABNORMAL HIGH (ref 4.0–10.5)
nRBC: 0 % (ref 0.0–0.2)

## 2020-09-22 LAB — ECHOCARDIOGRAM COMPLETE
AR max vel: 2.78 cm2
AV Peak grad: 3.9 mmHg
Ao pk vel: 0.99 m/s
Area-P 1/2: 7.02 cm2
Height: 69 in
S' Lateral: 3 cm
Weight: 3680 oz

## 2020-09-22 LAB — C-REACTIVE PROTEIN: CRP: 21 mg/dL — ABNORMAL HIGH (ref <1.0)

## 2020-09-22 LAB — SARS CORONAVIRUS 2 (TAT 6-24 HRS): SARS Coronavirus 2: POSITIVE — AB

## 2020-09-22 LAB — HIV ANTIBODY (ROUTINE TESTING W REFLEX): HIV Screen 4th Generation wRfx: NONREACTIVE

## 2020-09-22 LAB — PROCALCITONIN: Procalcitonin: 0.56 ng/mL

## 2020-09-22 NOTE — ED Notes (Signed)
Pt alert.  Pt on 2 liters oxygen [place].  nsr on monitor.

## 2020-09-22 NOTE — ED Notes (Signed)
RT called for ABG at this time. Per RT, will come and draw.

## 2020-09-22 NOTE — ED Notes (Addendum)
Pt given meal tray at this time. Pt denies further needs at this time 

## 2020-09-22 NOTE — ED Notes (Signed)
Pt given meal tray at this time 

## 2020-09-22 NOTE — ED Notes (Signed)
Pt chart updated and message sent to receiving floor RN.

## 2020-09-22 NOTE — Evaluation (Signed)
Physical Therapy Evaluation Patient Details Name: Richard Dean MRN: [id number] DOB: 05/24/1951 Today's Date: 09/22/2020   History of Present Illness  70 y.o. male with hypertension, hyperlipidemia who was COVID-positive on 1/2 who comes in for shortness of breath.  Patient reports oxygen levels dropped to the 80s when ambulating on room air at home.  Clinical Impression  Pt eager to see what he could do with some mobility/ambulation.  On arrival he was on 1L with sats in the high 90s, able to maintain high 90s during exercises/strength testing/bed mobility and we decided to try ambulation on room air.  Ultimately he was able to ambulate ~150 ft with safe and consistent cadence (reports slower than his baseline) and maintain decent vitals.  O2 slowly dropped to high 90s and HR slowly rose to 120s but generally speaking he was safe and did reasonably well for first activity attempted (and on room air).  Pt likely will not need PT once ready for d/c, but did educate that he needs to purposefully ease back into normal routine.    Follow Up Recommendations No PT follow up (will maintain on caseload while here to maintain/promote mobility)    Equipment Recommendations  None recommended by PT    Recommendations for Other Services       Precautions / Restrictions Precautions Precautions:  (airborn iso) Restrictions Weight Bearing Restrictions: No      Mobility  Bed Mobility Overal bed mobility: Independent             General bed mobility comments: able to get to sitting w/o hesitation, CGA only with getting back into bed    Transfers Overall transfer level: Modified independent Equipment used: Rolling walker (2 wheeled)             General transfer comment: Pt able to easily rise to standing and maintain balance w/o AD  Ambulation/Gait Ambulation/Gait assistance: Supervision Gait Distance (Feet): 150 Feet Assistive device: None       General Gait Details: Pt able to  ambulate prolonged distance with consistent cadence (some minimal hesitancy, slower than community appropriate) and no LOBs. He was on room air the entire time and kept O2 in the 90s nearly the entire time,  HR 100-110 most of the time but (O2 to high 80s and HR >120 by the end)  Stairs            Wheelchair Mobility    Modified Rankin (Stroke Patients Only)       Balance Overall balance assessment: Modified Independent (did not need UEs to maintain static or dynamic balance)                                           Pertinent Vitals/Pain Pain Assessment: No/denies pain    Home Living Family/patient expects to be discharged to:: Private residence Living Arrangements: Spouse/significant other;Other relatives;Children Available Help at Discharge: Family Type of Home: House Home Access: Ramped entrance     Home Layout: One level Home Equipment: Bedside commode;Walker - 2 wheels;Toilet riser;Hand held shower head;Grab bars - tub/shower      Prior Function Level of Independence: Independent         Comments: Ind amb without an AD with occasional RW use if fatigued , Ind with ADLs, one fall in the last year.  Pt drives and works, independent in medication Restaurant manager, [employer]  [place]        Extremity/Trunk Assessment   Upper Extremity Assessment Upper Extremity Assessment: Overall WFL for tasks assessed    Lower Extremity Assessment Lower Extremity Assessment: Overall WFL for tasks assessed       Communication   Communication: No difficulties  Cognition Arousal/Alertness: Awake/alert Behavior During Therapy: WFL for tasks assessed/performed Overall Cognitive Status: Within Functional Limits for tasks assessed                                        General Comments      Exercises     Assessment/Plan    PT Assessment Patient needs continued PT services  PT Problem List Decreased activity tolerance;Cardiopulmonary  status limiting activity;Decreased safety awareness;Decreased strength;Decreased balance;Decreased mobility       PT Treatment Interventions DME instruction;Gait training;Functional mobility training;Therapeutic activities;Therapeutic exercise;Patient/family education    PT Goals (Current goals can be found in the Care Plan section)  Acute Rehab PT Goals Patient Stated Goal: go home PT Goal Formulation: With patient Time For Goal Achievement: 10/06/20 Potential to Achieve Goals: Good    Frequency Min 2X/week   Barriers to discharge        Co-evaluation               AM-PAC PT "6 Clicks" Mobility  Outcome Measure Help needed turning from your back to your side while in a flat bed without using bedrails?: None Help needed moving from lying on your back to sitting on the side of a flat bed without using bedrails?: None Help needed moving to and from a bed to a chair (including a wheelchair)?: None Help needed standing up from a chair using your arms (e.g., wheelchair or bedside chair)?: None Help needed to walk in hospital room?: A Little Help needed climbing 3-5 steps with a railing? : A Little 6 Click Score: 22    End of Session Equipment Utilized During Treatment: Gait belt Activity Tolerance: Patient tolerated treatment well Patient left: with call bell/phone within reach;in bed Nurse Communication: Mobility status PT Visit Diagnosis: Muscle weakness (generalized) (M62.81);Difficulty in walking, not elsewhere classified (R26.2)    Time: 1610-9604 PT Time Calculation (min) (ACUTE ONLY): 31 min   Charges:   PT Evaluation $PT Eval Low Complexity: 1 Low PT Treatments $Gait Training: 8-22 mins        Kreg Shropshire, DPT 09/22/2020, 12:54 PM

## 2020-09-22 NOTE — ED Notes (Signed)
Pt denies any needs at this time. Pt reminded to use call bell to make needs known. Pt verbalized understanding at this time.

## 2020-09-22 NOTE — Progress Notes (Signed)
PROGRESS NOTE    Richard Dean  ZOX:[id number] DOB: [birth year] DOA: 09/21/2020 PCP: Danella Penton, MD   Brief Narrative:  70 y.o. male with medical history significant for HTN,BPH,GERD,HLD,OSA on CPAP who has interim history of diagnosis of COVID on home test 09/17/19 after having symptoms for 5 days of cough, fever/chills, as well as doe, nasal congestion and pressure similar to sinus infection. He contacted his primary care who wrote prescription for z-pak and prednisone x 10 days.  Patient states he has increase sob/doe today along with increasing fever and cough as well as intermittent confusion.  Patient's respiratory status and mental status is improved since admission.  Patient weaned to 2 L at time my evaluation this morning.   Assessment & Plan:   Active Problems:   Pneumonia due to COVID-19 virus   Acute hypoxemic respiratory failure due to COVID-19 (HCC)  COVID viral Pneumonia with associated hypoxic respiratory failure  -decadron 6mg  po x 10 days  - remdesivir x 5 days -patient currently with low O2 requirement and is not a candidate for tocilizumab at time,will continue re-evaluate need based on patient course  -self prone as able  -encourage po fluid intake  -daily monitoring of COVID severity labs -CTPA:negative for Pe -dvt ppx per protocol   If we are able to wean from supplemental oxygen possible discharge within 24 hours   Sinus tachycardia with frequent pvc  -electrolytes within normal limit -patient denies cardiac disease, but mentions hx of murmur  -ekg no hyperacute st-t wave changes , ce negative  -patient note chest pressure he attributes to breathing -he denies history of CHF, no noted findings of this diagnosis in his chart  -Echocardiogram for LVEF assessment -Follow-up lipid panel -continue with O2 supplementation    Anemia  -chronic low mcv -will check anemia panel   HTN -patient continues at his baseline hypertensive state  -will resume  Benicar and amlodipine  -will hold diuretic for now   Chronic lower extremity edema 10/11/2016  -due to lymphedema -controlled with bumetanide,  Hyperglycemia/glucose intolerance -check A1C -place on fs  BPH Not currently on treatment  No active issues  GERD -continue PPI  HLD -diet controlled   OSA - on CPAP qhs    DVT prophylaxis: SQ Lovenox Code Status: Full Family Communication: Wife Elson Clan Frazer [phone] on 09/22/2020 Disposition Plan: Status is: Inpatient  Remains inpatient appropriate because:Inpatient level of care appropriate due to severity of illness   Dispo: The patient is from: Home              Anticipated d/c is to: Home              Anticipated d/c date is: 1 [place]              Patient currently is not medically stable to d/c.    Resolving hypoxia in the setting of COVID-19 pneumonia.  Possible discharge in 24 hours if patient remains fever free and without oxygen requirement.     Consultants:   None  Procedures:   None  Antimicrobials:   Remdesivir   Subjective: Patient seen and examined.  Reports objective improvement in shortness of breath since admission.  No other complaints  Objective: Vitals:   09/22/20 0158 09/22/20 0200 09/22/20 0739 09/22/20 1154  BP:   (!) 134/116 136/70  Pulse:  80 98 95  Resp:  16 18 18   Temp: 98.7 F (37.1 C)     TempSrc: Oral     SpO2:  98% 95% 94%  Weight:      Height:        Intake/Output Summary (Last 24 hours) at 09/22/2020 1437 Last data filed at 09/22/2020 0121 Gross per 24 hour  Intake 1606.68 ml  Output -  Net 1606.68 ml   Filed Weights   09/21/20 1055  Weight: 104.3 kg    Examination:  General exam: Appears calm and comfortable  Respiratory system: Scattered crackles bilaterally.  Normal work of breathing.  2 L Cardiovascular system: S1 & S2 heard, RRR. No JVD, murmurs, rubs, gallops or clicks. No pedal edema. Gastrointestinal system: Abdomen is nondistended, soft and  nontender. No organomegaly or masses felt. Normal bowel sounds heard. Central nervous system: Alert and oriented. No focal neurological deficits. Extremities: Symmetric 5 x 5 power. Skin: No rashes, lesions or ulcers Psychiatry: Judgement and insight appear normal. Mood & affect appropriate.     Data Reviewed: I have personally reviewed following labs and imaging studies  CBC: Recent Labs  Lab 09/21/20 1048 09/21/20 2000 09/22/20 0535  WBC 12.7* 10.1 11.2*  HGB 12.5* 11.2* 12.5*  HCT 37.8* 35.3* 38.7*  MCV 79.4* 81.9 81.0  PLT 210 170 0000000   Basic Metabolic Panel: Recent Labs  Lab 09/21/20 1048 09/21/20 1814 09/21/20 2000  NA 138 138 138  K 4.2 4.1 4.2  CL 102 99 101  CO2 26 25 25   GLUCOSE 124* 97 123*  BUN 22 20 20   CREATININE 1.08 1.20 1.15  CALCIUM 8.9 8.3* 8.2*  MG 2.2 2.1  --   PHOS  --  3.6  --    GFR: Estimated Creatinine Clearance: 71.1 mL/min (by C-G formula based on SCr of 1.15 mg/dL). Liver Function Tests: Recent Labs  Lab 09/21/20 1048 09/21/20 2000  AST 19 22  ALT 25 21  ALKPHOS 62 57  BILITOT 2.0* 1.7*  PROT 7.3 6.8  ALBUMIN 3.9 3.5   No results for input(s): LIPASE, AMYLASE in the last 168 hours. No results for input(s): AMMONIA in the last 168 hours. Coagulation Profile: No results for input(s): INR, PROTIME in the last 168 hours. Cardiac Enzymes: No results for input(s): CKTOTAL, CKMB, CKMBINDEX, TROPONINI in the last 168 hours. BNP (last 3 results) No results for input(s): PROBNP in the last 8760 hours. HbA1C: Recent Labs    09/21/20 2000  HGBA1C 5.5   CBG: No results for input(s): GLUCAP in the last 168 hours. Lipid Profile: No results for input(s): CHOL, HDL, LDLCALC, TRIG, CHOLHDL, LDLDIRECT in the last 72 hours. Thyroid Function Tests: Recent Labs    09/21/20 2000  TSH 3.401   Anemia Panel: Recent Labs    09/21/20 2000  FERRITIN 111  TIBC 336  IRON 15*   Sepsis Labs: Recent Labs  Lab 09/21/20 1556  09/21/20 1814 09/22/20 0535  PROCALCITON 0.30  --  0.56  LATICACIDVEN  --  2.0*  --     Recent Results (from the past 240 hour(s))  Blood culture (routine x 2)     Status: None (Preliminary result)   Collection Time: 09/21/20  6:14 PM   Specimen: BLOOD  Result Value Ref Range Status   Specimen Description BLOOD RIGHT ANTECUBITAL  Final   Special Requests   Final    BOTTLES DRAWN AEROBIC AND ANAEROBIC Blood Culture results may not be optimal due to an inadequate volume of blood received in culture bottles   Culture   Final    NO GROWTH < 24 HOURS Performed at [facility], [address] [place], [place] [address]    Report Status PENDING  Incomplete  Blood culture (routine x 2)     Status: None (Preliminary result)   Collection Time: 09/21/20  6:14 PM   Specimen: BLOOD  Result Value Ref Range Status   Specimen Description BLOOD LEFT UPPER ARM  Final   Special Requests   Final    BOTTLES DRAWN AEROBIC AND ANAEROBIC Blood Culture adequate volume   Culture   Final    NO GROWTH < 24 HOURS Performed at [facility], [place], [place] [address]    Report Status PENDING  Incomplete  SARS CORONAVIRUS 2 (TAT 6-24 HRS) Nasopharyngeal Nasopharyngeal Swab     Status: Abnormal   Collection Time: 09/22/20  2:05 AM   Specimen: Nasopharyngeal Swab  Result Value Ref Range Status   SARS Coronavirus 2 POSITIVE (A) NEGATIVE Final    Comment: (NOTE) SARS-CoV-2 target nucleic acids are DETECTED.  The SARS-CoV-2 RNA is generally detectable in upper and lower respiratory specimens during the acute phase of infection. Positive results are indicative of the presence of SARS-CoV-2 RNA. Clinical correlation with patient history and other diagnostic information is  necessary to determine patient infection status. Positive results do not rule out bacterial infection or co-infection with other viruses.  The expected result is Negative.  Fact Sheet for  Patients: SugarRoll.be  Fact Sheet for Healthcare Providers: https://www.woods-mathews.com/  This test is not yet approved or cleared by the [place] FDA and  has been authorized for detection and/or diagnosis of SARS-CoV-2 by FDA under an Emergency Use Authorization (EUA). This EUA will remain  in effect (meaning this test can be used) for the duration of the COVID-19 declaration under Section 564(b)(1) of the Act, 21 U. S.C. section 360bbb-3(b)(1), unless the authorization is terminated or revoked sooner.   Performed at [facility], [place] [address] [place],  [address]          Radiology Studies: DG Chest 2 View  Result Date: 09/21/2020 CLINICAL DATA:  Cough, shortness of breath, COVID-19 positive EXAM: CHEST - 2 VIEW COMPARISON:  None. FINDINGS: The heart size and mediastinal contours are within normal limits. Low lung volumes. Streaky interstitial opacities within the mid to lower lung fields bilaterally. No pleural effusion or pneumothorax. Degenerative changes of the shoulders and spine. IMPRESSION: Streaky interstitial opacities within the mid to lower lung fields bilaterally. Findings suspicious for atypical/viral infection. Electronically Signed   By: Davina Poke D.O.   On: 09/21/2020 11:40   CT Angio Chest PE W and/or Wo Contrast  Result Date: 09/21/2020 CLINICAL DATA:  Tested COVID positive on 09/13/2020. Today woke up with shortness of breath, oxygen saturation in the 80s when ambulating on room air, trigeminy on monitor EXAM: CT ANGIOGRAPHY CHEST WITH CONTRAST TECHNIQUE: Multidetector CT imaging of the chest was performed using the standard protocol during bolus administration of intravenous contrast. Multiplanar CT image reconstructions and MIPs were obtained to evaluate the vascular anatomy. CONTRAST:  98mL OMNIPAQUE IOHEXOL 350 MG/ML SOLN IV COMPARISON:  None FINDINGS: Cardiovascular: Atherosclerotic  calcifications aorta and coronary arteries. Aorta normal caliber without aneurysm or dissection. Heart unremarkable. No pericardial effusion. Pulmonary arteries adequately opacified and patent. No evidence of pulmonary embolism. Mediastinum/Nodes: Base of cervical region normal appearance. Esophagus unremarkable. Question tiny hiatal hernia. No thoracic adenopathy. Lungs/Pleura: Patchy BILATERAL pulmonary infiltrates consistent with multifocal pneumonia and history of COVID-19. No pleural effusion or pneumothorax. Upper Abdomen: Fatty infiltration of liver. Spleen appears  mildly enlarged at measuring 14.6 x 6.1 x >11.2 cm (volume = 520+ cm^3) Musculoskeletal: No acute osseous findings. Review of the MIP images confirms the above findings. IMPRESSION: No evidence of pulmonary embolism. Patchy BILATERAL pulmonary infiltrates consistent with multifocal pneumonia and history of COVID-19. Mild splenomegaly and fatty infiltration of liver. Aortic Atherosclerosis (ICD10-I70.0). Electronically Signed   By: Lavonia Dana M.D.   On: 09/21/2020 16:26   US Venous Img Lower Bilateral (DVT)  Result Date: 09/21/2020 CLINICAL DATA:  Lower extremity swelling EXAM: BILATERAL LOWER EXTREMITY VENOUS DOPPLER ULTRASOUND TECHNIQUE: Gray-scale sonography with compression, as well as color and duplex ultrasound, were performed to evaluate the deep venous system(s) from the level of the common femoral vein through the popliteal and proximal calf veins. COMPARISON:  None. FINDINGS: VENOUS Normal compressibility of the common femoral, superficial femoral, and popliteal veins, as well as the visualized calf veins. Visualized portions of profunda femoral vein and great saphenous vein unremarkable. No filling defects to suggest DVT on grayscale or color Doppler imaging. Doppler waveforms show normal direction of venous flow, normal respiratory plasticity and response to augmentation. Limited views of the contralateral common femoral vein are  unremarkable. OTHER None. Limitations: none IMPRESSION: Negative. Electronically Signed   By: Ulyses Jarred M.D.   On: 09/21/2020 21:31   ECHOCARDIOGRAM COMPLETE  Result Date: 09/22/2020    ECHOCARDIOGRAM REPORT   Patient Name:   CREED KAIL Date of Exam: 09/21/2020 Medical Rec #:  [id number]    Height:       69.0 in Accession #:    [id number]   Weight:       230.0 lb Date of Birth:  06-15-1951    BSA:          2.192 m Patient Age:    56 years     BP:           113/96 mmHg Patient Gender: M            HR:           118 bpm. Exam Location:  [facility] Procedure: 2D Echo, Cardiac Doppler and Color Doppler Indications:     Abnormal ECG R94.31  History:         Patient has no prior history of Echocardiogram examinations.                  Signs/Symptoms:Murmur; Risk Factors:Hypertension.  Sonographer:     Alyse Low Roar Referring Phys:  [id number] Victoriano Lain A THOMAS Diagnosing Phys: Nelva Bush MD  Sonographer Comments: Technically difficult study due to poor echo windows. IMPRESSIONS  1. Left ventricular ejection fraction, by estimation, is >55%. The left ventricle has normal function. Left ventricular endocardial border not optimally defined to evaluate regional wall motion. There is mild left ventricular hypertrophy. Left ventricular diastolic parameters are indeterminate.  2. Pulmonary artery pressure is at least mildly elevated (PASP ~35 mmHg plus central venous/right atrial pressure). Right ventricular systolic function is normal. The right ventricular size is normal.  3. The mitral valve was not well visualized. Trivial mitral valve regurgitation. No evidence of mitral stenosis.  4. The aortic valve was not well visualized. Aortic valve regurgitation is not visualized. No aortic stenosis is present. FINDINGS  Left Ventricle: Left ventricular ejection fraction, by estimation, is >55%. The left ventricle has normal function. Left ventricular endocardial border not optimally defined to evaluate regional wall motion. The  left ventricular internal cavity size was  normal in size. There is mild left ventricular hypertrophy. Left  ventricular diastolic parameters are indeterminate. Right Ventricle: Pulmonary artery pressure is at least mildly elevated (PASP ~35 mmHg plus central venous/right atrial pressure). The right ventricular size is normal. No increase in right ventricular wall thickness. Right ventricular systolic function is normal. Left Atrium: Left atrial size was normal in size. Right Atrium: Right atrial size was normal in size. Pericardium: The pericardium was not well visualized. Mitral Valve: The mitral valve was not well visualized. Trivial mitral valve regurgitation. No evidence of mitral valve stenosis. Tricuspid Valve: The tricuspid valve is not well visualized. Tricuspid valve regurgitation is trivial. Aortic Valve: The aortic valve was not well visualized. Aortic valve regurgitation is not visualized. No aortic stenosis is present. Aortic valve peak gradient measures 3.9 mmHg. Pulmonic Valve: The pulmonic valve was not well visualized. Pulmonic valve regurgitation is not visualized. No evidence of pulmonic stenosis. Aorta: The aortic root is normal in size and structure. Pulmonary Artery: The pulmonary artery is of normal size. Venous: The inferior vena cava was not well visualized. IAS/Shunts: The interatrial septum was not well visualized.  LEFT VENTRICLE PLAX 2D LVIDd:         4.20 cm  Diastology LVIDs:         3.00 cm  LV e' medial:    8.38 cm/s LV PW:         1.10 cm  LV E/e' medial:  11.9 LV IVS:        1.10 cm  LV e' lateral:   8.05 cm/s LVOT diam:     1.90 cm  LV E/e' lateral: 12.4 LVOT Area:     2.84 cm  RIGHT VENTRICLE RV Mid diam:    3.10 cm RV S prime:     16.20 cm/s TAPSE (M-mode): 2.2 cm LEFT ATRIUM             Index       RIGHT ATRIUM           Index LA diam:        3.80 cm 1.73 cm/m  RA Area:     15.00 cm LA Vol (A2C):   57.7 ml 26.32 ml/m RA Volume:   35.30 ml  16.10 ml/m LA Vol (A4C):   55.1  ml 25.13 ml/m LA Biplane Vol: 58.1 ml 26.50 ml/m  AORTIC VALVE               PULMONIC VALVE AV Area (Vmax): 2.78 cm   PV Vmax:        1.49 m/s AV Vmax:        99.10 cm/s PV Peak grad:   8.9 mmHg AV Peak Grad:   3.9 mmHg   RVOT Peak grad: 4 mmHg LVOT Vmax:      97.00 cm/s  AORTA Ao Root diam: 3.10 cm MITRAL VALVE                TRICUSPID VALVE MV Area (PHT): 7.02 cm     TR Peak grad:   34.3 mmHg MV Decel Time: 108 msec     TR Vmax:        293.00 cm/s MV E velocity: 100.00 cm/s MV A velocity: 145.00 cm/s  SHUNTS MV E/A ratio:  0.69         Systemic Diam: 1.90 cm MV A Prime:    16.9 cm/s Nelva Bush MD Electronically signed by Nelva Bush MD Signature Date/Time: 09/22/2020/7:51:04 AM    Final         Scheduled Meds: . acetaminophen  1,000 mg Oral Once  . amLODipine  5 mg Oral QHS  . aspirin EC  81 mg Oral Daily  . dexamethasone  6 mg Oral Daily  . enoxaparin (LOVENOX) injection  0.5 mg/kg Subcutaneous Q24H  . guaiFENesin  600 mg Oral BID  . irbesartan  37.5 mg Oral Daily  . pantoprazole  40 mg Oral Daily  . sodium chloride flush  3 mL Intravenous Q12H   Continuous Infusions: . remdesivir 100 mg in NS 100 mL Stopped (09/22/20 1043)     LOS: 1 [place]    Time spent: 25 minutes    Sidney Ace, MD [facility] Pager [phone]  If 7PM-7AM, please contact night-coverage 09/22/2020, 2:37 PM

## 2020-09-23 DIAGNOSIS — U071 COVID-19: Secondary | ICD-10-CM | POA: Diagnosis not present

## 2020-09-23 DIAGNOSIS — J9601 Acute respiratory failure with hypoxia: Secondary | ICD-10-CM | POA: Diagnosis not present

## 2020-09-23 DIAGNOSIS — J1282 Pneumonia due to coronavirus disease 2019: Secondary | ICD-10-CM | POA: Diagnosis not present

## 2020-09-23 LAB — PROCALCITONIN: Procalcitonin: 0.51 ng/mL

## 2020-09-23 LAB — LACTIC ACID, PLASMA: Lactic Acid, Venous: 1.3 mmol/L (ref 0.5–1.9)

## 2020-09-23 MED ORDER — HYDROCOD POLST-CPM POLST ER 10-8 MG/5ML PO SUER: 5.0000 mL | Freq: Two times a day (BID) | ORAL | Status: DC | PRN

## 2020-09-23 MED ORDER — METHYLPREDNISOLONE SODIUM SUCC 40 MG IJ SOLR
40.0000 mg | Freq: Two times a day (BID) | INTRAMUSCULAR | Status: AC
  Administered 2020-09-23 (×2): 40 mg via INTRAVENOUS
  Filled 2020-09-23 (×2): qty 1

## 2020-09-23 MED ORDER — AMOXICILLIN-POT CLAVULANATE 875-125 MG PO TABS
1.0000 | ORAL_TABLET | Freq: Two times a day (BID) | ORAL | Status: DC
  Administered 2020-09-23 – 2020-09-25 (×5): 1 via ORAL
  Filled 2020-09-23 (×5): qty 1

## 2020-09-23 MED ORDER — GUAIFENESIN ER 600 MG PO TB12: 600.0000 mg | ORAL_TABLET | Freq: Two times a day (BID) | ORAL | Status: DC

## 2020-09-23 MED ORDER — FUROSEMIDE 10 MG/ML IJ SOLN
40.0000 mg | Freq: Once | INTRAMUSCULAR | Status: AC
  Administered 2020-09-23: 17:00:00 40 mg via INTRAVENOUS
  Filled 2020-09-23: qty 4

## 2020-09-23 NOTE — Progress Notes (Signed)
Physical Therapy Treatment Patient Details Name: Richard Dean MRN: [id number] DOB: 1951/07/11 Today's Date: 09/23/2020    History of Present Illness 70 y.o. male with hypertension, hyperlipidemia who was COVID-positive on 1/2 who comes in for shortness of breath.  Patient reports oxygen levels dropped to the 80s when ambulating on room air at home.    PT Comments    Pt eager to work with PT again, on 2L on arrival with sats in the high 90s, performed bed mobility and ambulation on room air and generally he was able to keep sats >90, however he fatigued quicker today with ambulation on room air and though his sats only dropped tot he mid 67s with that effort he did not recover as quickly as yesterday either and PT did reapply O2 post session (nursing notified of vitals over course of session).  He was able to get to EOB and standing w/o assist, however he was not nearly as steady/confident with hands free ambulation this date and needed to use the IV pole t/o ambulation (will try with walker next visit). Overall pt continues to generally do well, however most aspects of PT were more limited today than on eval.    Follow Up Recommendations  No PT follow up (per progress, may need HHPT)     Equipment Recommendations  None recommended by PT    Recommendations for Other Services       Precautions / Restrictions Precautions Precautions: Fall Restrictions Weight Bearing Restrictions: No    Mobility  Bed Mobility Overal bed mobility: Independent             General bed mobility comments: slightly slower than yesterday, but still needing no asssist and showing good confidence  Transfers Overall transfer level: Modified independent Equipment used: Rolling walker (2 wheeled)             General transfer comment: Again pt a little slower/more hesitant than yesterday but able to rise w/o assist.  More using UEs to maintain balance.  Ambulation/Gait Ambulation/Gait assistance:  Supervision Gait Distance (Feet): 75 Feet Assistive device: IV Pole       General Gait Details: Pt slower, more guarded today.  He attempted a few steps w/o UE support, but did not feel nearly as confident as yesterday and ended up holding (and needing) the IV pole t/o the effort.  Room air during the effort and his sats stayed in the low 90s most of the time with focused breathing, but he did drop into the mid 80s by the end with some shortness of breath.   Stairs             Wheelchair Mobility    Modified Rankin (Stroke Patients Only)       Balance Overall balance assessment: Modified Independent (no LOBs, but clear need for more support (UEs/IV pole) than on eval)                                          Cognition Arousal/Alertness: Awake/alert Behavior During Therapy: WFL for tasks assessed/performed Overall Cognitive Status: Within Functional Limits for tasks assessed                                        Exercises General Exercises - Lower Extremity Ankle Circles/Pumps: AROM;10 reps  Heel Slides: Strengthening;10 reps (with resisted leg extensions) Hip ABduction/ADduction: Strengthening;10 reps Straight Leg Raises: AROM;10 reps    General Comments        Pertinent Vitals/Pain Pain Assessment: No/denies pain    Home Living                      Prior Function            PT Goals (current goals can now be found in the care plan section) Progress towards PT goals: Not progressing toward goals - comment (less distance walking, less tolerance, more O2 need, more UE support need)    Frequency    Min 2X/week      PT Plan Current plan remains appropriate    Co-evaluation              AM-PAC PT "6 Clicks" Mobility   Outcome Measure  Help needed turning from your back to your side while in a flat bed without using bedrails?: None Help needed moving from lying on your back to sitting on the side of a  flat bed without using bedrails?: None Help needed moving to and from a bed to a chair (including a wheelchair)?: None Help needed standing up from a chair using your arms (e.g., wheelchair or bedside chair)?: None Help needed to walk in hospital room?: A Little Help needed climbing 3-5 steps with a railing? : A Little 6 Click Score: 22    End of Session Equipment Utilized During Treatment: Gait belt;Oxygen (2L on arrival, room air t/o exercises and ambulation, did reapply at the end as O2 was staying in the high 80s after the activity.) Activity Tolerance: Patient limited by fatigue Patient left: with bed alarm set;with call bell/phone within reach;with nursing/sitter in room Nurse Communication: Mobility status (vitals with activity) PT Visit Diagnosis: Muscle weakness (generalized) (M62.81);Difficulty in walking, not elsewhere classified (R26.2)     Time: 2585-2778 PT Time Calculation (min) (ACUTE ONLY): 27 min  Charges:  $Gait Training: 8-22 mins $Therapeutic Exercise: 8-22 mins                     Kreg Shropshire, DPT 09/23/2020, 3:35 PM

## 2020-09-23 NOTE — Plan of Care (Signed)

## 2020-09-23 NOTE — Progress Notes (Signed)
PROGRESS NOTE    Richard Dean  LTJ:[id number] DOB: Apr 25, 1951 DOA: 09/21/2020 PCP: Rusty Aus, MD   Brief Narrative:  71 y.o. male with medical history significant for HTN,BPH,GERD,HLD,OSA on CPAP who has interim history of diagnosis of COVID on home test 09/17/19 after having symptoms for 5 days of cough, fever/chills, as well as doe, nasal congestion and pressure similar to sinus infection. He contacted his primary care who wrote prescription for z-pak and prednisone x 10 days.  Patient states he has increase sob/doe today along with increasing fever and cough as well as intermittent confusion.  Patient's respiratory status and mental status is improved since admission.  Patient weaned to 2 L at time my evaluation this morning.  Remains on 2 L.  We have been unable to wean further at this time.   Assessment & Plan:   Active Problems:   Pneumonia due to COVID-19 virus   Acute hypoxemic respiratory failure due to COVID-19 ([place])  COVID viral Pneumonia with associated hypoxic respiratory failure  Patient has mild hypoxic respiratory failure.  He is requiring 2 L of oxygen.  We're unable to wean further at this time. Plan: Continue remdesivir Continue Solu-Medrol 40 mg IV every 12 hours Prone as tolerated I-S and flutter use Wean oxygen as tolerated Empiric Augmentin for possible bacterial coinfection Lasix 40 mg IV x1, reassess daily for diuretic need and tolerance   Sinus tachycardia with frequent pvc  -electrolytes within normal limit -patient denies cardiac disease, but mentions hx of murmur  -ekg no hyperacute st-t wave changes , ce negative  -patient note chest pressure he attributes to breathing -he denies history of CHF, no noted findings of this diagnosis in his chart  -Echocardiogram reassuring, normal LVEF -continue with O2 supplementation    Anemia  -chronic low mcv -Anemia panel indicative of anemia of chronic disease  HTN -patient continues at his  baseline hypertensive state  -will resume Benicar and amlodipine  -will hold diuretic for now   Chronic lower extremity edema 10/11/2016  -due to lymphedema -controlled with bumetanide,  Hyperglycemia/glucose intolerance -check A1C -place on fs  BPH Not currently on treatment  No active issues  GERD -continue PPI  HLD -diet controlled   OSA - on CPAP qhs    DVT prophylaxis: SQ Lovenox Code Status: Full Family Communication: Wife Marisa Severin Bentler [phone] on 09/22/2020 Disposition Plan: Status is: Inpatient  Remains inpatient appropriate because:Inpatient level of care appropriate due to severity of illness   Dispo: The patient is from: Home              Anticipated d/c is to: Home              Anticipated d/c date is: 1 [place]              Patient currently is not medically stable to d/c.    Persistent hypoxia in setting of COVID-19 pneumonia.  Possible discharge in 24 hours if were able to wean from supplemental oxygen.     Consultants:   None  Procedures:   None  Antimicrobials:   Remdesivir   Subjective: Patient seen and examined.  Still coughing.  Remains on 2 L  Objective: Vitals:   09/23/20 0502 09/23/20 0741 09/23/20 0742 09/23/20 1151  BP: (!) 155/90 (!) 157/101 (!) 141/90 127/68  Pulse: 88 76 64 78  Resp: 18 16  20   Temp: 99.4 F (37.4 C) 97.7 F (36.5 C)  98.5 F (36.9 C)  TempSrc:  Oral  SpO2: 94% 97%  97%  Weight:      Height:       No intake or output data in the 24 hours ending 09/23/20 1337 Filed Weights   09/21/20 1055  Weight: 104.3 kg    Examination:  General exam: Appears calm and comfortable  Respiratory system: Scattered crackles bilaterally.  Normal work of breathing.  2 L Cardiovascular system: S1 & S2 heard, RRR. No JVD, murmurs, rubs, gallops or clicks. No pedal edema. Gastrointestinal system: Abdomen is nondistended, soft and nontender. No organomegaly or masses felt. Normal bowel sounds heard. Central  nervous system: Alert and oriented. No focal neurological deficits. Extremities: Symmetric 5 x 5 power. Skin: No rashes, lesions or ulcers Psychiatry: Judgement and insight appear normal. Mood & affect appropriate.     Data Reviewed: I have personally reviewed following labs and imaging studies  CBC: Recent Labs  Lab 09/21/20 1048 09/21/20 2000 09/22/20 0535  WBC 12.7* 10.1 11.2*  HGB 12.5* 11.2* 12.5*  HCT 37.8* 35.3* 38.7*  MCV 79.4* 81.9 81.0  PLT 210 170 0000000   Basic Metabolic Panel: Recent Labs  Lab 09/21/20 1048 09/21/20 1814 09/21/20 2000  NA 138 138 138  K 4.2 4.1 4.2  CL 102 99 101  CO2 26 25 25   GLUCOSE 124* 97 123*  BUN 22 20 20   CREATININE 1.08 1.20 1.15  CALCIUM 8.9 8.3* 8.2*  MG 2.2 2.1  --   PHOS  --  3.6  --    GFR: Estimated Creatinine Clearance: 71.1 mL/min (by C-G formula based on SCr of 1.15 mg/dL). Liver Function Tests: Recent Labs  Lab 09/21/20 1048 09/21/20 2000  AST 19 22  ALT 25 21  ALKPHOS 62 57  BILITOT 2.0* 1.7*  PROT 7.3 6.8  ALBUMIN 3.9 3.5   No results for input(s): LIPASE, AMYLASE in the last 168 hours. No results for input(s): AMMONIA in the last 168 hours. Coagulation Profile: No results for input(s): INR, PROTIME in the last 168 hours. Cardiac Enzymes: No results for input(s): CKTOTAL, CKMB, CKMBINDEX, TROPONINI in the last 168 hours. BNP (last 3 results) No results for input(s): PROBNP in the last 8760 hours. HbA1C: Recent Labs    09/21/20 2000  HGBA1C 5.5   CBG: No results for input(s): GLUCAP in the last 168 hours. Lipid Profile: No results for input(s): CHOL, HDL, LDLCALC, TRIG, CHOLHDL, LDLDIRECT in the last 72 hours. Thyroid Function Tests: Recent Labs    09/21/20 2000  TSH 3.401   Anemia Panel: Recent Labs    09/21/20 2000  FERRITIN 111  TIBC 336  IRON 15*   Sepsis Labs: Recent Labs  Lab 09/21/20 1556 09/21/20 1814 09/22/20 0535 09/23/20 0621  PROCALCITON 0.30  --  0.56 0.51   LATICACIDVEN  --  2.0*  --  1.3    Recent Results (from the past 240 hour(s))  Blood culture (routine x 2)     Status: None (Preliminary result)   Collection Time: 09/21/20  6:14 PM   Specimen: BLOOD  Result Value Ref Range Status   Specimen Description BLOOD RIGHT ANTECUBITAL  Final   Special Requests   Final    BOTTLES DRAWN AEROBIC AND ANAEROBIC Blood Culture results may not be optimal due to an inadequate volume of blood received in culture bottles   Culture   Final    NO GROWTH 2 DAYS Performed at [facility], [address], [place], [place] [address]    Report Status PENDING  Incomplete  Blood culture (routine x 2)     Status: None (Preliminary result)   Collection Time: 09/21/20  6:14 PM   Specimen: BLOOD  Result Value Ref Range Status   Specimen Description BLOOD LEFT UPPER ARM  Final   Special Requests   Final    BOTTLES DRAWN AEROBIC AND ANAEROBIC Blood Culture adequate volume   Culture   Final    NO GROWTH 2 DAYS Performed at [facility], [place], [place] [address]    Report Status PENDING  Incomplete  SARS CORONAVIRUS 2 (TAT 6-24 HRS) Nasopharyngeal Nasopharyngeal Swab     Status: Abnormal   Collection Time: 09/22/20  2:05 AM   Specimen: Nasopharyngeal Swab  Result Value Ref Range Status   SARS Coronavirus 2 POSITIVE (A) NEGATIVE Final    Comment: (NOTE) SARS-CoV-2 target nucleic acids are DETECTED.  The SARS-CoV-2 RNA is generally detectable in upper and lower respiratory specimens during the acute phase of infection. Positive results are indicative of the presence of SARS-CoV-2 RNA. Clinical correlation with patient history and other diagnostic information is  necessary to determine patient infection status. Positive results do not rule out bacterial infection or co-infection with other viruses.  The expected result is Negative.  Fact Sheet for Patients: SugarRoll.be  Fact Sheet for  Healthcare Providers: https://www.woods-mathews.com/  This test is not yet approved or cleared by the [place] FDA and  has been authorized for detection and/or diagnosis of SARS-CoV-2 by FDA under an Emergency Use Authorization (EUA). This EUA will remain  in effect (meaning this test can be used) for the duration of the COVID-19 declaration under Section 564(b)(1) of the Act, 21 U. S.C. section 360bbb-3(b)(1), unless the authorization is terminated or revoked sooner.   Performed at [facility], [place] [address] [place],  [address]          Radiology Studies: CT Angio Chest PE W and/or Wo Contrast  Result Date: 09/21/2020 CLINICAL DATA:  Tested COVID positive on 09/13/2020. Today woke up with shortness of breath, oxygen saturation in the 80s when ambulating on room air, trigeminy on monitor EXAM: CT ANGIOGRAPHY CHEST WITH CONTRAST TECHNIQUE: Multidetector CT imaging of the chest was performed using the standard protocol during bolus administration of intravenous contrast. Multiplanar CT image reconstructions and MIPs were obtained to evaluate the vascular anatomy. CONTRAST:  41mL OMNIPAQUE IOHEXOL 350 MG/ML SOLN IV COMPARISON:  None FINDINGS: Cardiovascular: Atherosclerotic calcifications aorta and coronary arteries. Aorta normal caliber without aneurysm or dissection. Heart unremarkable. No pericardial effusion. Pulmonary arteries adequately opacified and patent. No evidence of pulmonary embolism. Mediastinum/Nodes: Base of cervical region normal appearance. Esophagus unremarkable. Question tiny hiatal hernia. No thoracic adenopathy. Lungs/Pleura: Patchy BILATERAL pulmonary infiltrates consistent with multifocal pneumonia and history of COVID-19. No pleural effusion or pneumothorax. Upper Abdomen: Fatty infiltration of liver. Spleen appears mildly enlarged at measuring 14.6 x 6.1 x >11.2 cm (volume = 520+ cm^3) Musculoskeletal: No acute osseous findings. Review  of the MIP images confirms the above findings. IMPRESSION: No evidence of pulmonary embolism. Patchy BILATERAL pulmonary infiltrates consistent with multifocal pneumonia and history of COVID-19. Mild splenomegaly and fatty infiltration of liver. Aortic Atherosclerosis (ICD10-I70.0). Electronically Signed   By: Lavonia Dana M.D.   On: 09/21/2020 16:26   US Venous Img Lower Bilateral (DVT)  Result Date: 09/21/2020 CLINICAL DATA:  Lower extremity swelling EXAM: BILATERAL LOWER EXTREMITY VENOUS DOPPLER ULTRASOUND TECHNIQUE: Gray-scale sonography with compression, as well as color and duplex ultrasound, were performed  to evaluate the deep venous system(s) from the level of the common femoral vein through the popliteal and proximal calf veins. COMPARISON:  None. FINDINGS: VENOUS Normal compressibility of the common femoral, superficial femoral, and popliteal veins, as well as the visualized calf veins. Visualized portions of profunda femoral vein and great saphenous vein unremarkable. No filling defects to suggest DVT on grayscale or color Doppler imaging. Doppler waveforms show normal direction of venous flow, normal respiratory plasticity and response to augmentation. Limited views of the contralateral common femoral vein are unremarkable. OTHER None. Limitations: none IMPRESSION: Negative. Electronically Signed   By: Ulyses Jarred M.D.   On: 09/21/2020 21:31   ECHOCARDIOGRAM COMPLETE  Result Date: 09/22/2020    ECHOCARDIOGRAM REPORT   Patient Name:   Richard Dean Date of Exam: 09/21/2020 Medical Rec #:  [id number]    Height:       69.0 in Accession #:    [id number]   Weight:       230.0 lb Date of Birth:  02/28/1951    BSA:          2.192 m Patient Age:    39 years     BP:           113/96 mmHg Patient Gender: M            HR:           118 bpm. Exam Location:  [facility] Procedure: 2D Echo, Cardiac Doppler and Color Doppler Indications:     Abnormal ECG R94.31  History:         Patient has no prior history of  Echocardiogram examinations.                  Signs/Symptoms:Murmur; Risk Factors:Hypertension.  Sonographer:     Alyse Low Roar Referring Phys:  [id number] Victoriano Lain A THOMAS Diagnosing Phys: Nelva Bush MD  Sonographer Comments: Technically difficult study due to poor echo windows. IMPRESSIONS  1. Left ventricular ejection fraction, by estimation, is >55%. The left ventricle has normal function. Left ventricular endocardial border not optimally defined to evaluate regional wall motion. There is mild left ventricular hypertrophy. Left ventricular diastolic parameters are indeterminate.  2. Pulmonary artery pressure is at least mildly elevated (PASP ~35 mmHg plus central venous/right atrial pressure). Right ventricular systolic function is normal. The right ventricular size is normal.  3. The mitral valve was not well visualized. Trivial mitral valve regurgitation. No evidence of mitral stenosis.  4. The aortic valve was not well visualized. Aortic valve regurgitation is not visualized. No aortic stenosis is present. FINDINGS  Left Ventricle: Left ventricular ejection fraction, by estimation, is >55%. The left ventricle has normal function. Left ventricular endocardial border not optimally defined to evaluate regional wall motion. The left ventricular internal cavity size was  normal in size. There is mild left ventricular hypertrophy. Left ventricular diastolic parameters are indeterminate. Right Ventricle: Pulmonary artery pressure is at least mildly elevated (PASP ~35 mmHg plus central venous/right atrial pressure). The right ventricular size is normal. No increase in right ventricular wall thickness. Right ventricular systolic function is normal. Left Atrium: Left atrial size was normal in size. Right Atrium: Right atrial size was normal in size. Pericardium: The pericardium was not well visualized. Mitral Valve: The mitral valve was not well visualized. Trivial mitral valve regurgitation. No evidence of mitral  valve stenosis. Tricuspid Valve: The tricuspid valve is not well visualized. Tricuspid valve regurgitation is trivial. Aortic Valve: The aortic valve was not well visualized.  Aortic valve regurgitation is not visualized. No aortic stenosis is present. Aortic valve peak gradient measures 3.9 mmHg. Pulmonic Valve: The pulmonic valve was not well visualized. Pulmonic valve regurgitation is not visualized. No evidence of pulmonic stenosis. Aorta: The aortic root is normal in size and structure. Pulmonary Artery: The pulmonary artery is of normal size. Venous: The inferior vena cava was not well visualized. IAS/Shunts: The interatrial septum was not well visualized.  LEFT VENTRICLE PLAX 2D LVIDd:         4.20 cm  Diastology LVIDs:         3.00 cm  LV e' medial:    8.38 cm/s LV PW:         1.10 cm  LV E/e' medial:  11.9 LV IVS:        1.10 cm  LV e' lateral:   8.05 cm/s LVOT diam:     1.90 cm  LV E/e' lateral: 12.4 LVOT Area:     2.84 cm  RIGHT VENTRICLE RV Mid diam:    3.10 cm RV S prime:     16.20 cm/s TAPSE (M-mode): 2.2 cm LEFT ATRIUM             Index       RIGHT ATRIUM           Index LA diam:        3.80 cm 1.73 cm/m  RA Area:     15.00 cm LA Vol (A2C):   57.7 ml 26.32 ml/m RA Volume:   35.30 ml  16.10 ml/m LA Vol (A4C):   55.1 ml 25.13 ml/m LA Biplane Vol: 58.1 ml 26.50 ml/m  AORTIC VALVE               PULMONIC VALVE AV Area (Vmax): 2.78 cm   PV Vmax:        1.49 m/s AV Vmax:        99.10 cm/s PV Peak grad:   8.9 mmHg AV Peak Grad:   3.9 mmHg   RVOT Peak grad: 4 mmHg LVOT Vmax:      97.00 cm/s  AORTA Ao Root diam: 3.10 cm MITRAL VALVE                TRICUSPID VALVE MV Area (PHT): 7.02 cm     TR Peak grad:   34.3 mmHg MV Decel Time: 108 msec     TR Vmax:        293.00 cm/s MV E velocity: 100.00 cm/s MV A velocity: 145.00 cm/s  SHUNTS MV E/A ratio:  0.69         Systemic Diam: 1.90 cm MV A Prime:    16.9 cm/s Nelva Bush MD Electronically signed by Nelva Bush MD Signature Date/Time:  09/22/2020/7:51:04 AM    Final         Scheduled Meds: . acetaminophen  1,000 mg Oral Once  . amLODipine  5 mg Oral QHS  . amoxicillin-clavulanate  1 tablet Oral Q12H  . aspirin EC  81 mg Oral Daily  . enoxaparin (LOVENOX) injection  0.5 mg/kg Subcutaneous Q24H  . furosemide  40 mg Intravenous Once  . guaiFENesin  600 mg Oral BID  . irbesartan  37.5 mg Oral Daily  . methylPREDNISolone (SOLU-MEDROL) injection  40 mg Intravenous Q12H  . pantoprazole  40 mg Oral Daily  . sodium chloride flush  3 mL Intravenous Q12H   Continuous Infusions: . remdesivir 100 mg in NS 100 mL 100 mg (09/23/20 1124)  LOS: 2 days    Time spent: 15 minutes    Sidney Ace, MD [facility] Pager [phone]  If 7PM-7AM, please contact night-coverage 09/23/2020, 1:37 PM

## 2020-09-23 NOTE — Progress Notes (Signed)
Pt request that nurse pull up all 4 side rails. Pt is educated on how to reach and release the side rail. Pt performs effective teach back.

## 2020-09-23 NOTE — Progress Notes (Signed)
CPAP placed in room, placed on stand-by.  Patient stated he will put himself on CPAP tonight. Settings for CPAP are: CPAP 5cmH2O (per patient's home settings) and FIO2 : 24%

## 2020-09-24 DIAGNOSIS — J9601 Acute respiratory failure with hypoxia: Secondary | ICD-10-CM | POA: Diagnosis not present

## 2020-09-24 DIAGNOSIS — J1282 Pneumonia due to coronavirus disease 2019: Secondary | ICD-10-CM | POA: Diagnosis not present

## 2020-09-24 DIAGNOSIS — U071 COVID-19: Secondary | ICD-10-CM | POA: Diagnosis not present

## 2020-09-24 LAB — CBC WITH DIFFERENTIAL/PLATELET
Abs Immature Granulocytes: 0.41 10*3/uL — ABNORMAL HIGH (ref 0.00–0.07)
Basophils Absolute: 0 10*3/uL (ref 0.0–0.1)
Basophils Relative: 0 %
Eosinophils Absolute: 0 10*3/uL (ref 0.0–0.5)
Eosinophils Relative: 0 %
HCT: 39.6 % (ref 39.0–52.0)
Hemoglobin: 13.1 g/dL (ref 13.0–17.0)
Immature Granulocytes: 3 %
Lymphocytes Relative: 23 %
Lymphs Abs: 3.2 10*3/uL (ref 0.7–4.0)
MCH: 26 pg (ref 26.0–34.0)
MCHC: 33.1 g/dL (ref 30.0–36.0)
MCV: 78.6 fL — ABNORMAL LOW (ref 80.0–100.0)
Monocytes Absolute: 0.5 10*3/uL (ref 0.1–1.0)
Monocytes Relative: 4 %
Neutro Abs: 9.9 10*3/uL — ABNORMAL HIGH (ref 1.7–7.7)
Neutrophils Relative %: 70 %
Platelets: 275 10*3/uL (ref 150–400)
RBC: 5.04 MIL/uL (ref 4.22–5.81)
RDW: 16.4 % — ABNORMAL HIGH (ref 11.5–15.5)
WBC: 14.1 10*3/uL — ABNORMAL HIGH (ref 4.0–10.5)
nRBC: 0 % (ref 0.0–0.2)

## 2020-09-24 LAB — BASIC METABOLIC PANEL
Anion gap: 13 (ref 5–15)
BUN: 37 mg/dL — ABNORMAL HIGH (ref 8–23)
CO2: 23 mmol/L (ref 22–32)
Calcium: 8.9 mg/dL (ref 8.9–10.3)
Chloride: 104 mmol/L (ref 98–111)
Creatinine, Ser: 1.22 mg/dL (ref 0.61–1.24)
GFR, Estimated: >60 mL/min (ref >60)
Glucose, Bld: 136 mg/dL — ABNORMAL HIGH (ref 70–99)
Potassium: 4.2 mmol/L (ref 3.5–5.1)
Sodium: 140 mmol/L (ref 135–145)

## 2020-09-24 LAB — C-REACTIVE PROTEIN: CRP: 10.6 mg/dL — ABNORMAL HIGH (ref <1.0)

## 2020-09-24 MED ORDER — FUROSEMIDE 10 MG/ML IJ SOLN
20.0000 mg | Freq: Once | INTRAMUSCULAR | Status: AC
  Administered 2020-09-24: 15:00:00 20 mg via INTRAVENOUS
  Filled 2020-09-24: qty 2

## 2020-09-24 NOTE — Progress Notes (Addendum)
PROGRESS NOTE    Richard Dean  JAS:[id number] DOB: 04/18/1951 DOA: 09/21/2020 PCP: Rusty Aus, MD   Brief Narrative:  71 y.o. male with medical history significant for HTN,BPH,GERD,HLD,OSA on CPAP who has interim history of diagnosis of COVID on home test 09/17/19 after having symptoms for 5 days of cough, fever/chills, as well as doe, nasal congestion and pressure similar to sinus infection. He contacted his primary care who wrote prescription for z-pak and prednisone x 10 days.  Patient states he has increase sob/doe today along with increasing fever and cough as well as intermittent confusion.  Patient's respiratory status and mental status is improved since admission.  Patient weaned to 2 L at time my evaluation this morning.  Remains on 2 L.  We have been unable to wean further at this time. Attempts to secure safe discharge have been unsuccessful thus far   Assessment & Plan:   Active Problems:   Pneumonia due to COVID-19 virus   Acute hypoxemic respiratory failure due to COVID-19 ([place])  COVID viral Pneumonia with associated hypoxic respiratory failure  Patient has mild hypoxic respiratory failure.   He is requiring 2 L of oxygen.   We're unable to wean further at this time. Upon ambulation attempt, patient became hypoxic and dizzy Sepsis ruled out Plan: Continue remdesivir Continue Solu-Medrol 40 mg IV every 12 hours Prone as tolerated I-S and flutter use Wean oxygen as tolerated Empiric Augmentin for possible bacterial coinfection Lasix 20 mg IV x1, reassess daily for diuretic need and tolerance    Sinus tachycardia with frequent pvc  -electrolytes within normal limit -patient denies cardiac disease, but mentions hx of murmur  -ekg no hyperacute st-t wave changes , ce negative  -patient note chest pressure he attributes to breathing -he denies history of CHF, no noted findings of this diagnosis in his chart  -Echocardiogram reassuring, normal LVEF -continue  with O2 supplementation    Anemia  -chronic low mcv -Anemia panel indicative of anemia of chronic disease  HTN -patient continues at his baseline hypertensive state  -will resume Benicar and amlodipine  -Hold home diuretic while on IV lasix  Chronic lower extremity edema 10/11/2016  -due to lymphedema -controlled with bumetanide at home - On IV lasix here  Hyperglycemia/glucose intolerance -check A1C: 5.5% -hyperglycemia likely 2/2 steroids - SSI  BPH Not currently on treatment  No active issues  GERD -continue PPI  HLD -diet controlled   OSA - on CPAP qhs    DVT prophylaxis: SQ Lovenox Code Status: Full Family Communication: Wife [name] [phone] on 09/22/2020.  Daughter via phone in room 1/13 Disposition Plan: Status is: Inpatient  Remains inpatient appropriate because:Inpatient level of care appropriate due to severity of illness   Dispo: The patient is from: Home              Anticipated d/c is to: Home              Anticipated d/c date is: 1 [place]              Patient currently is not medically stable to d/c.    Remains hypoxic and symptomatic.  Will re-assess daily for discharge appropriateness.     Consultants:   None  Procedures:   None  Antimicrobials:   Remdesivir   Subjective: Patient seen and examined.  Still coughing.  Remains on 2 L  Objective: Vitals:   09/24/20 0012 09/24/20 0406 09/24/20 0753 09/24/20 1134  BP:  140/85 (!) 145/83 Marland Kitchen)  142/72  Pulse:  78 78 85  Resp:  18 17 19   Temp:  98.4 F (36.9 C) (!) 97.5 F (36.4 C) 97.6 F (36.4 C)  TempSrc:   Oral   SpO2: 93% 94% (!) 89% (!) 89%  Weight:      Height:        Intake/Output Summary (Last 24 hours) at 09/24/2020 1530 Last data filed at 09/23/2020 2200 Gross per 24 hour  Intake --  Output 1100 ml  Net -1100 ml   Filed Weights   09/21/20 1055  Weight: 104.3 kg    Examination:  General exam: Appears calm and comfortable  Respiratory system:  Scattered crackles bilaterally.  Normal work of breathing.  2 L Cardiovascular system: S1 & S2 heard, RRR. No JVD, murmurs, rubs, gallops or clicks. No pedal edema. Gastrointestinal system: Abdomen is nondistended, soft and nontender. No organomegaly or masses felt. Normal bowel sounds heard. Central nervous system: Alert and oriented. No focal neurological deficits. Extremities: Symmetric 5 x 5 power. Skin: No rashes, lesions or ulcers Psychiatry: Judgement and insight appear normal. Mood & affect appropriate.     Data Reviewed: I have personally reviewed following labs and imaging studies  CBC: Recent Labs  Lab 09/21/20 1048 09/21/20 2000 09/22/20 0535 09/24/20 0827  WBC 12.7* 10.1 11.2* 14.1*  NEUTROABS  --   --   --  9.9*  HGB 12.5* 11.2* 12.5* 13.1  HCT 37.8* 35.3* 38.7* 39.6  MCV 79.4* 81.9 81.0 78.6*  PLT 210 170 176 [id number]   Basic Metabolic Panel: Recent Labs  Lab 09/21/20 1048 09/21/20 1814 09/21/20 2000 09/24/20 0827  NA 138 138 138 140  K 4.2 4.1 4.2 4.2  CL 102 99 101 104  CO2 26 25 25 23   GLUCOSE 124* 97 123* 136*  BUN 22 20 20  37*  CREATININE 1.08 1.20 1.15 1.22  CALCIUM 8.9 8.3* 8.2* 8.9  MG 2.2 2.1  --   --   PHOS  --  3.6  --   --    GFR: Estimated Creatinine Clearance: 67 mL/min (by C-G formula based on SCr of 1.22 mg/dL). Liver Function Tests: Recent Labs  Lab 09/21/20 1048 09/21/20 2000  AST 19 22  ALT 25 21  ALKPHOS 62 57  BILITOT 2.0* 1.7*  PROT 7.3 6.8  ALBUMIN 3.9 3.5   No results for input(s): LIPASE, AMYLASE in the last 168 hours. No results for input(s): AMMONIA in the last 168 hours. Coagulation Profile: No results for input(s): INR, PROTIME in the last 168 hours. Cardiac Enzymes: No results for input(s): CKTOTAL, CKMB, CKMBINDEX, TROPONINI in the last 168 hours. BNP (last 3 results) No results for input(s): PROBNP in the last 8760 hours. HbA1C: Recent Labs    09/21/20 2000  HGBA1C 5.5   CBG: No results for input(s):  GLUCAP in the last 168 hours. Lipid Profile: No results for input(s): CHOL, HDL, LDLCALC, TRIG, CHOLHDL, LDLDIRECT in the last 72 hours. Thyroid Function Tests: Recent Labs    09/21/20 2000  TSH 3.401   Anemia Panel: Recent Labs    09/21/20 2000  FERRITIN 111  TIBC 336  IRON 15*   Sepsis Labs: Recent Labs  Lab 09/21/20 1556 09/21/20 1814 09/22/20 0535 09/23/20 0621  PROCALCITON 0.30  --  0.56 0.51  LATICACIDVEN  --  2.0*  --  1.3    Recent Results (from the past 240 hour(s))  Blood culture (routine x 2)     Status: None (Preliminary result)  Collection Time: 09/21/20  6:14 PM   Specimen: BLOOD  Result Value Ref Range Status   Specimen Description BLOOD RIGHT ANTECUBITAL  Final   Special Requests   Final    BOTTLES DRAWN AEROBIC AND ANAEROBIC Blood Culture results may not be optimal due to an inadequate volume of blood received in culture bottles   Culture   Final    NO GROWTH 3 DAYS Performed at [facility], [address] [place], [place] [address]    Report Status PENDING  Incomplete  Blood culture (routine x 2)     Status: None (Preliminary result)   Collection Time: 09/21/20  6:14 PM   Specimen: BLOOD  Result Value Ref Range Status   Specimen Description BLOOD LEFT UPPER ARM  Final   Special Requests   Final    BOTTLES DRAWN AEROBIC AND ANAEROBIC Blood Culture adequate volume   Culture   Final    NO GROWTH 3 DAYS Performed at [facility], [place], [place] [address]    Report Status PENDING  Incomplete  SARS CORONAVIRUS 2 (TAT 6-24 HRS) Nasopharyngeal Nasopharyngeal Swab     Status: Abnormal   Collection Time: 09/22/20  2:05 AM   Specimen: Nasopharyngeal Swab  Result Value Ref Range Status   SARS Coronavirus 2 POSITIVE (A) NEGATIVE Final    Comment: (NOTE) SARS-CoV-2 target nucleic acids are DETECTED.  The SARS-CoV-2 RNA is generally detectable in upper and lower respiratory specimens during the acute phase of  infection. Positive results are indicative of the presence of SARS-CoV-2 RNA. Clinical correlation with patient history and other diagnostic information is  necessary to determine patient infection status. Positive results do not rule out bacterial infection or co-infection with other viruses.  The expected result is Negative.  Fact Sheet for Patients: SugarRoll.be  Fact Sheet for Healthcare Providers: https://www.woods-mathews.com/  This test is not yet approved or cleared by the [place] FDA and  has been authorized for detection and/or diagnosis of SARS-CoV-2 by FDA under an Emergency Use Authorization (EUA). This EUA will remain  in effect (meaning this test can be used) for the duration of the COVID-19 declaration under Section 564(b)(1) of the Act, 21 U. S.C. section 360bbb-3(b)(1), unless the authorization is terminated or revoked sooner.   Performed at [facility], [place] [address], [place], [place] [address]          Radiology Studies: No results found.      Scheduled Meds: . acetaminophen  1,000 mg Oral Once  . amLODipine  5 mg Oral QHS  . amoxicillin-clavulanate  1 tablet Oral Q12H  . aspirin EC  81 mg Oral Daily  . enoxaparin (LOVENOX) injection  0.5 mg/kg Subcutaneous Q24H  . guaiFENesin  600 mg Oral BID  . irbesartan  37.5 mg Oral Daily  . pantoprazole  40 mg Oral Daily  . sodium chloride flush  3 mL Intravenous Q12H   Continuous Infusions: . remdesivir 100 mg in NS 100 mL 100 mg (09/24/20 1039)     LOS: 3 days    Time spent: 15 minutes    Sidney Ace, MD [facility] Pager [phone]  If 7PM-7AM, please contact night-coverage 09/24/2020, 3:30 PM

## 2020-09-25 DIAGNOSIS — U071 COVID-19: Secondary | ICD-10-CM | POA: Diagnosis not present

## 2020-09-25 DIAGNOSIS — J9601 Acute respiratory failure with hypoxia: Secondary | ICD-10-CM | POA: Diagnosis not present

## 2020-09-25 LAB — CBC WITH DIFFERENTIAL/PLATELET
Abs Immature Granulocytes: 0.49 10*3/uL — ABNORMAL HIGH (ref 0.00–0.07)
Basophils Absolute: 0.1 10*3/uL (ref 0.0–0.1)
Basophils Relative: 1 %
Eosinophils Absolute: 0 10*3/uL (ref 0.0–0.5)
Eosinophils Relative: 0 %
HCT: 40.2 % (ref 39.0–52.0)
Hemoglobin: 13.3 g/dL (ref 13.0–17.0)
Immature Granulocytes: 4 %
Lymphocytes Relative: 35 %
Lymphs Abs: 4 10*3/uL (ref 0.7–4.0)
MCH: 25.9 pg — ABNORMAL LOW (ref 26.0–34.0)
MCHC: 33.1 g/dL (ref 30.0–36.0)
MCV: 78.4 fL — ABNORMAL LOW (ref 80.0–100.0)
Monocytes Absolute: 0.7 10*3/uL (ref 0.1–1.0)
Monocytes Relative: 6 %
Neutro Abs: 6.2 10*3/uL (ref 1.7–7.7)
Neutrophils Relative %: 54 %
Platelets: 265 10*3/uL (ref 150–400)
RBC: 5.13 MIL/uL (ref 4.22–5.81)
RDW: 16.4 % — ABNORMAL HIGH (ref 11.5–15.5)
WBC: 11.5 10*3/uL — ABNORMAL HIGH (ref 4.0–10.5)
nRBC: 0 % (ref 0.0–0.2)

## 2020-09-25 LAB — BASIC METABOLIC PANEL
Anion gap: 14 (ref 5–15)
BUN: 35 mg/dL — ABNORMAL HIGH (ref 8–23)
CO2: 24 mmol/L (ref 22–32)
Calcium: 8.1 mg/dL — ABNORMAL LOW (ref 8.9–10.3)
Chloride: 106 mmol/L (ref 98–111)
Creatinine, Ser: 1.09 mg/dL (ref 0.61–1.24)
GFR, Estimated: >60 mL/min (ref >60)
Glucose, Bld: 96 mg/dL (ref 70–99)
Potassium: 3.9 mmol/L (ref 3.5–5.1)
Sodium: 144 mmol/L (ref 135–145)

## 2020-09-25 MED ORDER — PREDNISONE 20 MG PO TABS: 40.0000 mg | ORAL_TABLET | Freq: Every day | ORAL | 0 refills | Status: AC

## 2020-09-25 MED ORDER — ACETAMINOPHEN 325 MG PO TABS
650.0000 mg | ORAL_TABLET | Freq: Four times a day (QID) | ORAL | Status: AC | PRN
Start: 2020-09-25 — End: ?

## 2020-09-25 MED ORDER — ALBUTEROL SULFATE HFA 108 (90 BASE) MCG/ACT IN AERS: 2.0000 | INHALATION_SPRAY | Freq: Four times a day (QID) | RESPIRATORY_TRACT | 0 refills | Status: AC | PRN

## 2020-09-25 MED ORDER — BENZONATATE 100 MG PO CAPS: 100.0000 mg | ORAL_CAPSULE | Freq: Three times a day (TID) | ORAL | 0 refills | Status: AC | PRN

## 2020-09-25 MED ORDER — AMOXICILLIN-POT CLAVULANATE 875-125 MG PO TABS: 1.0000 | ORAL_TABLET | Freq: Two times a day (BID) | ORAL | 0 refills | Status: AC

## 2020-09-25 NOTE — Discharge Instructions (Signed)
10 Things You Can Do to Manage Your COVID-19 Symptoms at Home If you have possible or confirmed COVID-19: 1. Stay home except to get medical care. 2. Monitor your symptoms carefully. If your symptoms get worse, call your healthcare provider immediately. 3. Get rest and stay hydrated. 4. If you have a medical appointment, call the healthcare provider ahead of time and tell them that you have or may have COVID-19. 5. For medical emergencies, call 911 and notify the dispatch personnel that you have or may have COVID-19. 6. Cover your cough and sneezes with a tissue or use the inside of your elbow. 7. Wash your hands often with soap and water for at least 20 seconds or clean your hands with an alcohol-based hand sanitizer that contains at least 60% alcohol. 8. As much as possible, stay in a specific room and away from other people in your home. Also, you should use a separate bathroom, if available. If you need to be around other people in or outside of the home, wear a mask. 9. Avoid sharing personal items with other people in your household, like dishes, towels, and bedding. 10. Clean all surfaces that are touched often, like counters, tabletops, and doorknobs. Use household cleaning sprays or wipes according to the label instructions. [email or web address] 03/27/2020 This information is not intended to replace advice given to you by your health care provider. Make sure you discuss any questions you have with your health care provider. Document Revised: 07/13/2020 Document Reviewed: 07/13/2020 Elsevier Patient Education  2021 [place]: What to Do if You Are Sick If you have a fever, cough or other symptoms, you might have COVID-19. Most people have mild illness and are able to recover at home. If you are sick:  Keep track of your symptoms.  If you have an emergency warning sign (including trouble breathing), call 911. Steps to help prevent the spread of COVID-19 if you are  sick If you are sick with COVID-19 or think you might have COVID-19, follow the steps below to care for yourself and to help protect other people in your home and community. Stay home except to get medical care  Stay home. Most people with COVID-19 have mild illness and can recover at home without medical care. Do not leave your home, except to get medical care. Do not visit public areas.  Take care of yourself. Get rest and stay hydrated. Take over-the-counter medicines, such as acetaminophen, to help you feel better.  Stay in touch with your doctor. Call before you get medical care. Be sure to get care if you have trouble breathing, or have any other emergency warning signs, or if you think it is an emergency.  Avoid public transportation, ride-sharing, or taxis. Separate yourself from other people As much as possible, stay in a specific room and away from other people and pets in your home. If possible, you should use a separate bathroom. If you need to be around other people or animals in or outside of the home, wear a mask. Tell your close contactsthat they may have been exposed to COVID-19. An infected person can spread COVID-19 starting 48 hours (or 2 days) before the person has any symptoms or tests positive. By letting your close contacts know they may have been exposed to COVID-19, you are helping to protect everyone.  Additional guidance is available for those living in close quarters and shared housing.  See COVID-19 and Animals if you have questions about pets.  If you  are diagnosed with COVID-19, someone from the health department may call you. Answer the call to slow the spread. Monitor your symptoms  Symptoms of COVID-19 include fever, cough, or other symptoms.  Follow care instructions from your healthcare provider and local health department. Your local health authorities may give instructions on checking your symptoms and reporting information. When to seek emergency  medical attention Look for emergency warning signs* for COVID-19. If someone is showing any of these signs, seek emergency medical care immediately:  Trouble breathing  Persistent pain or pressure in the chest  New confusion  Inability to wake or stay awake  Pale, gray, or blue-colored skin, lips, or nail beds, depending on skin tone *This list is not all possible symptoms. Please call your medical provider for any other symptoms that are severe or concerning to you. Call 911 or call ahead to your local emergency facility: Notify the operator that you are seeking care for someone who has or may have COVID-19. Call ahead before visiting your doctor  Call ahead. Many medical visits for routine care are being postponed or done by phone or telemedicine.  If you have a medical appointment that cannot be postponed, call your doctor's office, and tell them you have or may have COVID-19. This will help the office protect themselves and other patients. Get  tested  If you have symptoms of COVID-19, get tested. While waiting for test results, you stay away from others, including staying apart from those living in your household.  You can visit your state, tribal, local, and territorialhealth department's website to look for the latest local information on testing sites. If you are sick, wear a mask over your nose and mouth  You should wear a mask over your nose and mouth if you must be around other people or animals, including pets (even at home).  You don't need to wear the mask if you are alone. If you can't put on a mask (because of trouble breathing, for example), cover your coughs and sneezes in some other way. Try to stay at least 6 feet away from other people. This will help protect the people around you.  Masks should not be placed on young children under age 48 years, anyone who has trouble breathing, or anyone who is not able to remove the mask without help. Note: During the COVID-19  pandemic, medical grade facemasks are reserved for healthcare workers and some first responders. Cover your coughs and sneezes  Cover your mouth and nose with a tissue when you cough or sneeze.  Throw away used tissues in a lined trash can.  Immediately wash your hands with soap and water for at least 20 seconds. If soap and water are not available, clean your hands with an alcohol-based hand sanitizer that contains at least 60% alcohol. Clean your hands often  Wash your hands often with soap and water for at least 20 seconds. This is especially important after blowing your nose, coughing, or sneezing; going to the bathroom; and before eating or preparing food.  Use hand sanitizer if soap and water are not available. Use an alcohol-based hand sanitizer with at least 60% alcohol, covering all surfaces of your hands and rubbing them together until they feel dry.  Soap and water are the best option, especially if hands are visibly dirty.  Avoid touching your eyes, nose, and mouth with unwashed hands.  Handwashing Tips Avoid sharing personal household items  Do not share dishes, drinking glasses, cups, eating utensils, towels,  or bedding with other people in your home.  Wash these items thoroughly after using them with soap and water or put in the dishwasher. Clean all "high-touch" surfaces everyday  Clean and disinfect high-touch surfaces in your "sick room" and bathroom; wear disposable gloves. Let someone else clean and disinfect surfaces in common areas, but you should clean your bedroom and bathroom, if possible.  If a caregiver or other person needs to clean and disinfect a sick person's bedroom or bathroom, they should do so on an as-needed basis. The caregiver/other person should wear a mask and disposable gloves prior to cleaning. They should wait as long as possible after the person who is sick has used the bathroom before coming in to clean and use the bathroom. ? High-touch  surfaces include phones, remote controls, counters, tabletops, doorknobs, bathroom fixtures, toilets, keyboards, tablets, and bedside tables.  Clean and disinfect areas that may have blood, stool, or body fluids on them.  Use household cleaners and disinfectants. Clean the area or item with soap and water or another detergent if it is dirty. Then, use a household disinfectant. ? Be sure to follow the instructions on the label to ensure safe and effective use of the product. Many products recommend keeping the surface wet for several minutes to ensure germs are killed. Many also recommend precautions such as wearing gloves and making sure you have good ventilation during use of the product. ? Use a product from [employer] List N: Disinfectants for Coronavirus (XBMWU-13). ? Complete Disinfection Guidance When you can be around others after being sick with COVID-19 Deciding when you can be around others is different for different situations. Find out when you can safely end home isolation. For any additional questions about your care, contact your healthcare provider or state or local health department. 11/27/2019 Content source: [facility] ([facility]), Division of Viral Diseases This information is not intended to replace advice given to you by your health care provider. Make sure you discuss any questions you have with your health care provider. Document Revised: 07/13/2020 Document Reviewed: 07/13/2020 Elsevier Patient Education  2021 [place] Nurse, Nurse Aide, Physical and Occupational Therapy

## 2020-09-25 NOTE — Progress Notes (Signed)
Physical Therapy Treatment Patient Details Name: Richard Dean MRN: [id number] DOB: 1951/04/19 Today's Date: 09/25/2020    History of Present Illness 70 y.o. male with hypertension, hyperlipidemia who was COVID-positive on 1/2 who comes in for shortness of breath.  Patient reports oxygen levels dropped to the 80s when ambulating on room air at home.    PT Comments    Pt was long sititng in bed on 1 L o2 upon arriving with sao2 98%. Discontinued O2 throughout session with O2 on rm air > 90% throughout. RN aware at conclusion of session that pt is left off [place](o2) Pt was easily able to exit bed, stand and ambulate however needed RW for safety. Pt will benefit form HHPT at DC to address deficits while assisting pt to PLOF. Pt does live with spouse who will be abale to assist pt at home once DC'd. Acute PT will continue to follow and progress per POC.   Follow Up Recommendations  Home health PT;Other (comment) (pt would benefit from [facility] services to assist pt to PLOF. Currently requiring use of AD and was not prior to admission.)     Equipment Recommendations  None recommended by PT    Recommendations for Other Services       Precautions / Restrictions Precautions Precautions: Fall Restrictions Weight Bearing Restrictions: No    Mobility  Bed Mobility Overal bed mobility: Independent                Transfers Overall transfer level: Modified independent Equipment used: Rolling walker (2 wheeled)             General transfer comment: no difficulty with transfers  Ambulation/Gait Ambulation/Gait assistance: Supervision Gait Distance (Feet): 120 Feet Assistive device: Rolling walker (2 wheeled) Gait Pattern/deviations: Step-through pattern Gait velocity: decreased   General Gait Details: Pt was able to ambulate 120 ft on rm air without difficulty. No LOB or safety concerns with gait. pt did report feeling fatigue and slightly SOB but overall said, "I feel fine."       Balance Overall balance assessment: Modified Independent       Cognition Arousal/Alertness: Awake/alert Behavior During Therapy: WFL for tasks assessed/performed Overall Cognitive Status: Within Functional Limits for tasks assessed      General Comments: Pty is A and O x 4. Agrees to PT session and is very motivated to return home             Pertinent Vitals/Pain Pain Assessment: No/denies pain           PT Goals (current goals can now be found in the care plan section) Acute Rehab PT Goals Patient Stated Goal: go home Progress towards PT goals: Progressing toward goals    Frequency    Min 2X/week      PT Plan Current plan remains appropriate       AM-PAC PT "6 Clicks" Mobility   Outcome Measure  Help needed turning from your back to your side while in a flat bed without using bedrails?: None Help needed moving from lying on your back to sitting on the side of a flat bed without using bedrails?: None Help needed moving to and from a bed to a chair (including a wheelchair)?: None Help needed standing up from a chair using your arms (e.g., wheelchair or bedside chair)?: None Help needed to walk in hospital room?: A Little Help needed climbing 3-5 steps with a railing? : A Little 6 Click Score: 22    End of Session  Equipment Utilized During Treatment: Gait belt;Oxygen (discontinued O2 prior to session) Activity Tolerance: Patient limited by fatigue Patient left: with bed alarm set;with call bell/phone within reach;with nursing/sitter in room;Other (comment) (CM in room) Nurse Communication: Mobility status PT Visit Diagnosis: Muscle weakness (generalized) (M62.81);Difficulty in walking, not elsewhere classified (R26.2)     Time: 8242-3536 PT Time Calculation (min) (ACUTE ONLY): 28 min  Charges:  $Gait Training: 8-22 mins $Therapeutic Activity: 8-22 mins                     Julaine Fusi PTA 09/25/20, 12:02 PM

## 2020-09-25 NOTE — TOC Transition Note (Addendum)
Transition of Care [facility]) - CM/SW Discharge Note   Patient Details  Name: Richard Dean MRN: [id number] Date of Birth: 11-29-1950  [facility] CM/SW Contact:  Shelbie Hutching, RN Phone Number: 09/25/2020, 2:56 PM   Clinical Narrative:    [name] has accepted referral for home health PT, OT, and aide.  [place] is not in network with patient's plan.  Message left for patient on his voicemail that home health has been arranged.    Final next level of care: [place] Barriers to Discharge: Barriers Resolved   Patient Goals and CMS Choice Patient states their goals for this hospitalization and ongoing recovery are:: To go home CMS Medicare.gov Compare Post Acute Care list provided to:: Patient Choice offered to / list presented to : Patient  Discharge Placement                       Discharge Plan and Services   Discharge Planning Services: CM Consult Post Acute Care Choice: Home Health          DME Arranged: N/A DME Agency: NA       HH Arranged: PT,OT,Nurse's Aide HH Agency: [place] Date [facility] Agency Contacted: 09/25/20 Time [place]: 7341 Representative spoke with at [place]: [place] ([place]) Interventions     Readmission Risk Interventions No flowsheet data found.

## 2020-09-25 NOTE — TOC Transition Note (Signed)
Transition of Care [facility]) - CM/SW Discharge Note   Patient Details  Name: Richard Dean MRN: [id number] Date of Birth: [birth year]  Transition of Care [facility]) CM/SW Contact:  Shelbie Hutching, RN Phone Number: 09/25/2020, 1:47 PM   Clinical Narrative:    Patient admitted to the hospital with COVID initially requiring acute oxygen.  Patient is weaned down to room air and doing well.  Patient is now medically stable for discharge home with home health services.  Patient has no preference in agency, [facility] given referral they will check to see if the they are in network with the patient's insurance.  Patient's wife will be picking him up today.     Final next level of care: [place] Barriers to Discharge: Barriers Resolved   Patient Goals and CMS Choice Patient states their goals for this hospitalization and ongoing recovery are:: To go home CMS Medicare.gov Compare Post Acute Care list provided to:: Patient Choice offered to / list presented to : Patient  Discharge Placement                       Discharge Plan and Services   Discharge Planning Services: CM Consult Post Acute Care Choice: Home Health          DME Arranged: N/A DME Agency: NA       HH Arranged: RN,PT,OT,Nurse's Aide [place] Agency: [place] ([place]) Date HH Agency Contacted: 09/25/20 Time [place]: [place] Representative spoke with at [place]: [place] (SDOH) Interventions     Readmission Risk Interventions No flowsheet data found.

## 2020-09-25 NOTE — Progress Notes (Signed)
Pt's ride [facility] entrance; pt discharged via wheelchair by nursing to the medical mall entrance 

## 2020-09-25 NOTE — Plan of Care (Signed)

## 2020-09-25 NOTE — Discharge Summary (Signed)
Physician Discharge Summary  Richard Dean [id number] DOB: July 09, 1951 DOA: 09/21/2020  PCP: Rusty Aus, MD  Admit date: 09/21/2020 Discharge date: 09/25/2020  Admitted From: Home Disposition: Home with home health  Recommendations for Outpatient Follow-up:  1. Follow up with PCP in 1-2 weeks 2.   Home Health:Yes Equipment/Devices:No Discharge Condition:Stable CODE STATUS:FULL Diet recommendation: Heart Healthy  Brief/Interim Summary: 70 y.o.malewith medical history significantfor HTN,BPH,GERD,HLD,OSA on CPAP who has interim history of diagnosis of COVID on home test 09/17/19 after having symptoms for 5 days of cough, fever/chills, as well as doe, nasal congestion and pressure similar to sinus infection. He contacted his primary care who wrote prescription for z-pak and prednisone x 10 days. Patient states he has increase sob/doe today along with increasing fever and cough as well as intermittent confusion.  Patient's respiratory status and mental status is improved since admission.  Patient weaned to 2 L at time my evaluation this morning.  We were able to wean entirely from supplemental oxygen.  On the [place] of discharge patient was weaned from supplemental oxygen and ambulated.  His saturations remained above 90% without desaturation.  Patient remained asymptomatic.  He is stable for discharge at this time.  Home health services ordered on discharge.  I spoke with the wife via phone, answered her questions.  Patient stable for discharge home at this time.  Discharge Diagnoses:  Active Problems:   Pneumonia due to COVID-19 virus   Acute hypoxemic respiratory failure due to COVID-19 ([place])  COVID viral Pneumonia with associated hypoxic respiratory failure  Patient only required 2 to 3 L of oxygen during admission.  We were able to wean to room air at time of discharge.  Intravenous steroids were administered and will be transition to p.o. prednisone at time of discharge.  Will  discharge on 40 mg daily x6 days following discharge.  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 Can resume home Bumex on discharge  Hyperglycemia/glucose intolerance -check A1C: 5.5% -hyperglycemia likely 2/2 steroids No indication for diabetic therapy on discharge  BPH Not currently on treatment  No active issues  GERD -continue PPI  HLD -diet controlled   OSA - on CPAP qhs   Discharge Instructions  Discharge Instructions    Diet - low sodium heart healthy   Complete by: As directed    Increase activity slowly   Complete by: As directed      Allergies as of 09/25/2020   No Known Allergies     Medication List    STOP taking these medications   azithromycin 250 MG tablet Commonly known as: ZITHROMAX     TAKE these medications   acetaminophen 325 MG tablet Commonly known as: TYLENOL Take 2 tablets (650 mg total) by mouth every 6 (six) hours as needed for mild pain or fever (or Fever >/= 101).   albuterol 108 (90 Base) MCG/ACT inhaler Commonly known as: VENTOLIN HFA Inhale 2 puffs into the lungs every 6 (six) hours as needed for wheezing or shortness of breath.   AMBULATORY NON FORMULARY  MEDICATION Medication Name: Azelaic Acid 15%, Metronidazole 1%, Ivermectin 1% Cream ([employer])   amLODipine 5 MG tablet Commonly known as: NORVASC Take 5 mg by mouth at bedtime.   amoxicillin-clavulanate 875-125 MG tablet Commonly known as: AUGMENTIN Take 1 tablet by mouth  every 12 (twelve) hours for 3 days.   aspirin EC 81 MG tablet Take 81 mg by mouth daily.   Bee Pollen 580 MG Caps Take 1 capsule by mouth daily.   benzonatate 100 MG capsule Commonly known as: Tessalon Perles Take 1 capsule (100 mg total) by mouth 3 (three) times daily as needed for cough.   bumetanide 1 MG tablet Commonly known as: BUMEX Take 1 mg by mouth daily.   olmesartan 20 MG tablet Commonly known as: BENICAR Take 20 mg by mouth daily.   pantoprazole 40 MG tablet Commonly known as: PROTONIX Take 40 mg by mouth daily.   potassium chloride 10 MEQ tablet Commonly known as: KLOR-CON Take 10 mEq by mouth 2 (two) times daily.   predniSONE 20 MG tablet Commonly known as: Deltasone Take 2 tablets (40 mg total) by mouth daily for 6 days.       Follow-up Information    Rusty Aus, MD. Schedule an appointment as soon as possible for a visit in 1 week.   Specialty: Internal Medicine Contact information: [place] [address] [phone]              No Known Allergies  Consultations:  None   Procedures/Studies: DG Chest 2 View  Result Date: 09/21/2020 CLINICAL DATA:  Cough, shortness of breath, COVID-19 positive EXAM: CHEST - 2 VIEW COMPARISON:  None. FINDINGS: The heart size and mediastinal contours are within normal limits. Low lung volumes. Streaky interstitial opacities within the mid to lower lung fields bilaterally. No pleural effusion or pneumothorax. Degenerative changes of the shoulders and spine. IMPRESSION: Streaky interstitial opacities within the mid to lower lung fields bilaterally. Findings suspicious for atypical/viral infection. Electronically Signed   By: Davina Poke D.O.   On: 09/21/2020 11:40   CT Angio Chest PE W and/or Wo Contrast  Result Date: 09/21/2020 CLINICAL DATA:  Tested COVID positive on 09/13/2020. Today woke up with shortness of breath, oxygen saturation in the  80s when ambulating on room air, trigeminy on monitor EXAM: CT ANGIOGRAPHY CHEST WITH CONTRAST TECHNIQUE: Multidetector CT imaging of the chest was performed using the standard protocol during bolus administration of intravenous contrast. Multiplanar CT image reconstructions and MIPs were obtained to evaluate the vascular anatomy. CONTRAST:  71mL OMNIPAQUE IOHEXOL 350 MG/ML SOLN IV COMPARISON:  None FINDINGS: Cardiovascular: Atherosclerotic calcifications aorta and coronary arteries. Aorta normal caliber without aneurysm or dissection. Heart unremarkable. No pericardial effusion. Pulmonary arteries adequately opacified and patent. No evidence of pulmonary embolism. Mediastinum/Nodes: Base of cervical region normal appearance. Esophagus unremarkable. Question tiny hiatal hernia. No thoracic adenopathy. Lungs/Pleura: Patchy BILATERAL pulmonary infiltrates consistent with multifocal pneumonia and history of COVID-19. No pleural effusion or pneumothorax. Upper Abdomen: Fatty infiltration of liver. Spleen appears mildly enlarged at measuring 14.6 x 6.1 x >11.2 cm (volume = 520+ cm^3) Musculoskeletal: No acute osseous findings. Review of the MIP images confirms the above findings. IMPRESSION: No evidence of pulmonary embolism. Patchy BILATERAL pulmonary infiltrates consistent with multifocal pneumonia and history of COVID-19. Mild splenomegaly and fatty infiltration of liver. Aortic Atherosclerosis (ICD10-I70.0). Electronically Signed   By: Lavonia Dana M.D.   On: 09/21/2020 16:26   US Venous Img  Lower Bilateral (DVT)  Result Date: 09/21/2020 CLINICAL DATA:  Lower extremity swelling EXAM: BILATERAL LOWER EXTREMITY VENOUS DOPPLER ULTRASOUND TECHNIQUE: Gray-scale sonography with compression, as well as color and duplex ultrasound, were performed to evaluate the deep venous system(s) from the level of the common femoral vein through the popliteal and proximal calf veins. COMPARISON:  None. FINDINGS: VENOUS Normal  compressibility of the common femoral, superficial femoral, and popliteal veins, as well as the visualized calf veins. Visualized portions of profunda femoral vein and great saphenous vein unremarkable. No filling defects to suggest DVT on grayscale or color Doppler imaging. Doppler waveforms show normal direction of venous flow, normal respiratory plasticity and response to augmentation. Limited views of the contralateral common femoral vein are unremarkable. OTHER None. Limitations: none IMPRESSION: Negative. Electronically Signed   By: Ulyses Jarred M.D.   On: 09/21/2020 21:31   ECHOCARDIOGRAM COMPLETE  Result Date: 09/22/2020    ECHOCARDIOGRAM REPORT   Patient Name:   Richard Dean Date of Exam: 09/21/2020 Medical Rec #:  [id number]    Height:       69.0 in Accession #:    [id number]   Weight:       230.0 lb Date of Birth:  [birth year]    BSA:          2.192 m Patient Age:    52 years     BP:           113/96 mmHg Patient Gender: M            HR:           118 bpm. Exam Location:  [facility] Procedure: 2D Echo, Cardiac Doppler and Color Doppler Indications:     Abnormal ECG R94.31  History:         Patient has no prior history of Echocardiogram examinations.                  Signs/Symptoms:Murmur; Risk Factors:Hypertension.  Sonographer:     Alyse Low Roar Referring Phys:  [id number] Victoriano Lain A THOMAS Diagnosing Phys: Nelva Bush MD  Sonographer Comments: Technically difficult study due to poor echo windows. IMPRESSIONS  1. Left ventricular ejection fraction, by estimation, is >55%. The left ventricle has normal function. Left ventricular endocardial border not optimally defined to evaluate regional wall motion. There is mild left ventricular hypertrophy. Left ventricular diastolic parameters are indeterminate.  2. Pulmonary artery pressure is at least mildly elevated (PASP ~35 mmHg plus central venous/right atrial pressure). Right ventricular systolic function is normal. The right ventricular size is normal.  3. The  mitral valve was not well visualized. Trivial mitral valve regurgitation. No evidence of mitral stenosis.  4. The aortic valve was not well visualized. Aortic valve regurgitation is not visualized. No aortic stenosis is present. FINDINGS  Left Ventricle: Left ventricular ejection fraction, by estimation, is >55%. The left ventricle has normal function. Left ventricular endocardial border not optimally defined to evaluate regional wall motion. The left ventricular internal cavity size was  normal in size. There is mild left ventricular hypertrophy. Left ventricular diastolic parameters are indeterminate. Right Ventricle: Pulmonary artery pressure is at least mildly elevated (PASP ~35 mmHg plus central venous/right atrial pressure). The right ventricular size is normal. No increase in right ventricular wall thickness. Right ventricular systolic function is normal. Left Atrium: Left atrial size was normal in size. Right Atrium: Right atrial size was normal in size. Pericardium: The pericardium was not well visualized. Mitral Valve: The mitral valve was not well  visualized. Trivial mitral valve regurgitation. No evidence of mitral valve stenosis. Tricuspid Valve: The tricuspid valve is not well visualized. Tricuspid valve regurgitation is trivial. Aortic Valve: The aortic valve was not well visualized. Aortic valve regurgitation is not visualized. No aortic stenosis is present. Aortic valve peak gradient measures 3.9 mmHg. Pulmonic Valve: The pulmonic valve was not well visualized. Pulmonic valve regurgitation is not visualized. No evidence of pulmonic stenosis. Aorta: The aortic root is normal in size and structure. Pulmonary Artery: The pulmonary artery is of normal size. Venous: The inferior vena cava was not well visualized. IAS/Shunts: The interatrial septum was not well visualized.  LEFT VENTRICLE PLAX 2D LVIDd:         4.20 cm  Diastology LVIDs:         3.00 cm  LV e' medial:    8.38 cm/s LV PW:         1.10 cm   LV E/e' medial:  11.9 LV IVS:        1.10 cm  LV e' lateral:   8.05 cm/s LVOT diam:     1.90 cm  LV E/e' lateral: 12.4 LVOT Area:     2.84 cm  RIGHT VENTRICLE RV Mid diam:    3.10 cm RV S prime:     16.20 cm/s TAPSE (M-mode): 2.2 cm LEFT ATRIUM             Index       RIGHT ATRIUM           Index LA diam:        3.80 cm 1.73 cm/m  RA Area:     15.00 cm LA Vol (A2C):   57.7 ml 26.32 ml/m RA Volume:   35.30 ml  16.10 ml/m LA Vol (A4C):   55.1 ml 25.13 ml/m LA Biplane Vol: 58.1 ml 26.50 ml/m  AORTIC VALVE               PULMONIC VALVE AV Area (Vmax): 2.78 cm   PV Vmax:        1.49 m/s AV Vmax:        99.10 cm/s PV Peak grad:   8.9 mmHg AV Peak Grad:   3.9 mmHg   RVOT Peak grad: 4 mmHg LVOT Vmax:      97.00 cm/s  AORTA Ao Root diam: 3.10 cm MITRAL VALVE                TRICUSPID VALVE MV Area (PHT): 7.02 cm     TR Peak grad:   34.3 mmHg MV Decel Time: 108 msec     TR Vmax:        293.00 cm/s MV E velocity: 100.00 cm/s MV A velocity: 145.00 cm/s  SHUNTS MV E/A ratio:  0.69         Systemic Diam: 1.90 cm MV A Prime:    16.9 cm/s Nelva Bush MD Electronically signed by Nelva Bush MD Signature Date/Time: 09/22/2020/7:51:04 AM    Final     (Echo, Carotid, EGD, Colonoscopy, ERCP)    Subjective: Patient seen and examined on the [place] of discharge.  Stable, no distress.  Still with some mild cough but no shortness of breath.  Speaking in complete sentences.  Normal work of breathing.  Stable for discharge  Discharge Exam: Vitals:   09/25/20 0800 09/25/20 1155  BP: (!) 153/83 108/71  Pulse: 83 88  Resp: 18 20  Temp: 98.5 F (36.9 C) 98.9 F (37.2 C)  SpO2: 95%  90%   Vitals:   09/25/20 0021 09/25/20 0518 09/25/20 0800 09/25/20 1155  BP: 127/70 131/75 (!) 153/83 108/71  Pulse: 82 87 83 88  Resp: 20 18 18 20   Temp: 98.2 F (36.8 C) 98.6 F (37 C) 98.5 F (36.9 C) 98.9 F (37.2 C)  TempSrc:      SpO2: (!) 89% 93% 95% 90%  Weight:      Height:        General: Pt is alert, awake, not in  acute distress Cardiovascular: RRR, S1/S2 +, no rubs, no gallops Respiratory: CTA bilaterally, no wheezing, no rhonchi Abdominal: Soft, NT, ND, bowel sounds + Extremities: no edema, no cyanosis    The results of significant diagnostics from this hospitalization (including imaging, microbiology, ancillary and laboratory) are listed below for reference.     Microbiology: Recent Results (from the past 240 hour(s))  Blood culture (routine x 2)     Status: None (Preliminary result)   Collection Time: 09/21/20  6:14 PM   Specimen: BLOOD  Result Value Ref Range Status   Specimen Description BLOOD RIGHT ANTECUBITAL  Final   Special Requests   Final    BOTTLES DRAWN AEROBIC AND ANAEROBIC Blood Culture results may not be optimal due to an inadequate volume of blood received in culture bottles   Culture   Final    NO GROWTH 4 DAYS Performed at [facility], [address], [place], [place] [address]    Report Status PENDING  Incomplete  Blood culture (routine x 2)     Status: None (Preliminary result)   Collection Time: 09/21/20  6:14 PM   Specimen: BLOOD  Result Value Ref Range Status   Specimen Description BLOOD LEFT UPPER ARM  Final   Special Requests   Final    BOTTLES DRAWN AEROBIC AND ANAEROBIC Blood Culture adequate volume   Culture   Final    NO GROWTH 4 DAYS Performed at [facility], [address], [place], [place] [address]    Report Status PENDING  Incomplete  SARS CORONAVIRUS 2 (TAT 6-24 HRS) Nasopharyngeal Nasopharyngeal Swab     Status: Abnormal   Collection Time: 09/22/20  2:05 AM   Specimen: Nasopharyngeal Swab  Result Value Ref Range Status   SARS Coronavirus 2 POSITIVE (A) NEGATIVE Final    Comment: (NOTE) SARS-CoV-2 target nucleic acids are DETECTED.  The SARS-CoV-2 RNA is generally detectable in upper and lower respiratory specimens during the acute phase of infection. Positive results are indicative of the presence of SARS-CoV-2 RNA.  Clinical correlation with patient history and other diagnostic information is  necessary to determine patient infection status. Positive results do not rule out bacterial infection or co-infection with other viruses.  The expected result is Negative.  Fact Sheet for Patients: SugarRoll.be  Fact Sheet for Healthcare Providers: https://www.woods-mathews.com/  This test is not yet approved or cleared by the [place] FDA and  has been authorized for detection and/or diagnosis of SARS-CoV-2 by FDA under an Emergency Use Authorization (EUA). This EUA will remain  in effect (meaning this test can be used) for the duration of the COVID-19 declaration under Section 564(b)(1) of the Act, 21 U. S.C. section 360bbb-3(b)(1), unless the authorization is terminated or revoked sooner.   Performed at [facility], [place] [address], [place], [place] [address]      Labs: BNP (last 3 results) Recent Labs    09/21/20 1814  BNP [id number]*   Basic Metabolic Panel: Recent Labs  Lab 09/21/20 1048 09/21/20 1814 09/21/20 2000 09/24/20 0827 09/25/20 0049  NA 138 138 138 140 144  K 4.2 4.1 4.2 4.2 3.9  CL 102 99 101 104 106  CO2 26 25 25 23 24   GLUCOSE 124* 97 123* 136* 96  BUN 22 20 20  37* 35*  CREATININE 1.08 1.20 1.15 1.22 1.09  CALCIUM 8.9 8.3* 8.2* 8.9 8.1*  MG 2.2 2.1  --   --   --   PHOS  --  3.6  --   --   --    Liver Function Tests: Recent Labs  Lab 09/21/20 1048 09/21/20 2000  AST 19 22  ALT 25 21  ALKPHOS 62 57  BILITOT 2.0* 1.7*  PROT 7.3 6.8  ALBUMIN 3.9 3.5   No results for input(s): LIPASE, AMYLASE in the last 168 hours. No results for input(s): AMMONIA in the last 168 hours. CBC: Recent Labs  Lab 09/21/20 1048 09/21/20 2000 09/22/20 0535 09/24/20 0827 09/25/20 0049  WBC 12.7* 10.1 11.2* 14.1* 11.5*  NEUTROABS  --   --   --  9.9* 6.2  HGB 12.5* 11.2* 12.5* 13.1 13.3  HCT 37.8* 35.3* 38.7* 39.6 40.2  MCV 79.4*  81.9 81.0 78.6* 78.4*  PLT 210 170 176 275 265   Cardiac Enzymes: No results for input(s): CKTOTAL, CKMB, CKMBINDEX, TROPONINI in the last 168 hours. BNP: Invalid input(s): POCBNP CBG: No results for input(s): GLUCAP in the last 168 hours. D-Dimer No results for input(s): DDIMER in the last 72 hours. Hgb A1c No results for input(s): HGBA1C in the last 72 hours. Lipid Profile No results for input(s): CHOL, HDL, LDLCALC, TRIG, CHOLHDL, LDLDIRECT in the last 72 hours. Thyroid function studies No results for input(s): TSH, T4TOTAL, T3FREE, THYROIDAB in the last 72 hours.  Invalid input(s): FREET3 Anemia work up No results for input(s): VITAMINB12, FOLATE, FERRITIN, TIBC, IRON, RETICCTPCT in the last 72 hours. Urinalysis    Component Value Date/Time   COLORURINE YELLOW (A) 02/19/2020 1513   APPEARANCEUR HAZY (A) 02/19/2020 1513   LABSPEC 1.027 02/19/2020 1513   PHURINE 5.0 02/19/2020 1513   GLUCOSEU NEGATIVE 02/19/2020 1513   HGBUR NEGATIVE 02/19/2020 1513   BILIRUBINUR NEGATIVE 02/19/2020 1513   KETONESUR NEGATIVE 02/19/2020 1513   PROTEINUR NEGATIVE 02/19/2020 1513   NITRITE NEGATIVE 02/19/2020 1513   LEUKOCYTESUR NEGATIVE 02/19/2020 1513   Sepsis Labs Invalid input(s): PROCALCITONIN,  WBC,  LACTICIDVEN Microbiology Recent Results (from the past 240 hour(s))  Blood culture (routine x 2)     Status: None (Preliminary result)   Collection Time: 09/21/20  6:14 PM   Specimen: BLOOD  Result Value Ref Range Status   Specimen Description BLOOD RIGHT ANTECUBITAL  Final   Special Requests   Final    BOTTLES DRAWN AEROBIC AND ANAEROBIC Blood Culture results may not be optimal due to an inadequate volume of blood received in culture bottles   Culture   Final    NO GROWTH 4 DAYS Performed at [facility], [address], [place], [place] [address]    Report Status PENDING  Incomplete  Blood culture (routine x 2)     Status: None (Preliminary result)   Collection Time:  09/21/20  6:14 PM   Specimen: BLOOD  Result Value Ref Range Status   Specimen Description BLOOD LEFT UPPER ARM  Final   Special Requests   Final    BOTTLES DRAWN AEROBIC AND ANAEROBIC Blood Culture adequate volume   Culture   Final  NO GROWTH 4 DAYS Performed at [facility], [place], [place] [address]    Report Status PENDING  Incomplete  SARS CORONAVIRUS 2 (TAT 6-24 HRS) Nasopharyngeal Nasopharyngeal Swab     Status: Abnormal   Collection Time: 09/22/20  2:05 AM   Specimen: Nasopharyngeal Swab  Result Value Ref Range Status   SARS Coronavirus 2 POSITIVE (A) NEGATIVE Final    Comment: (NOTE) SARS-CoV-2 target nucleic acids are DETECTED.  The SARS-CoV-2 RNA is generally detectable in upper and lower respiratory specimens during the acute phase of infection. Positive results are indicative of the presence of SARS-CoV-2 RNA. Clinical correlation with patient history and other diagnostic information is  necessary to determine patient infection status. Positive results do not rule out bacterial infection or co-infection with other viruses.  The expected result is Negative.  Fact Sheet for Patients: SugarRoll.be  Fact Sheet for Healthcare Providers: https://www.woods-mathews.com/  This test is not yet approved or cleared by the [place] FDA and  has been authorized for detection and/or diagnosis of SARS-CoV-2 by FDA under an Emergency Use Authorization (EUA). This EUA will remain  in effect (meaning this test can be used) for the duration of the COVID-19 declaration under Section 564(b)(1) of the Act, 21 U. S.C. section 360bbb-3(b)(1), unless the authorization is terminated or revoked sooner.   Performed at [facility], [place] [address] [place], [place] [address]      Time coordinating discharge: Over 30 minutes  SIGNED:   Sidney Ace, MD  [facility] 09/25/2020, 12:31  PM Pager   If 7PM-7AM, please contact night-coverage

## 2020-09-25 NOTE — Progress Notes (Signed)
MD order received in [facility] to discharge pt home with home health today; TOC previously established Home Health RN, NT, PT and OT services with [place]; verbally reviewed AVS with pt, Rxs escribed to [facility]; no questions voiced by the pt at this time; pt's discharge pending arrival of his wife at the [place] entrance

## 2020-09-26 LAB — CULTURE, BLOOD (ROUTINE X 2)
Culture: NO GROWTH
Culture: NO GROWTH
Special Requests: ADEQUATE

## 2020-10-28 LAB — BLOOD GAS, ARTERIAL
Acid-Base Excess: 5.8 mmol/L — ABNORMAL HIGH (ref 0.0–2.0)
Bicarbonate: 29.8 mmol/L — ABNORMAL HIGH (ref 20.0–28.0)
FIO2: 0.24
O2 Saturation: 72.6 %
Patient temperature: 37
pCO2 arterial: 40 mmHg (ref 32.0–48.0)
pH, Arterial: 7.48 — ABNORMAL HIGH (ref 7.350–7.450)
pO2, Arterial: 35 mmHg — CL (ref 83.0–108.0)

## 2020-12-28 ENCOUNTER — Ambulatory Visit: Payer: Managed Care, Other (non HMO) | Admitting: Dermatology

## 2021-05-31 ENCOUNTER — Ambulatory Visit (INDEPENDENT_AMBULATORY_CARE_PROVIDER_SITE_OTHER): Payer: Managed Care, Other (non HMO) | Admitting: Dermatology

## 2021-05-31 ENCOUNTER — Other Ambulatory Visit: Payer: Self-pay

## 2021-05-31 DIAGNOSIS — D692 Other nonthrombocytopenic purpura: Secondary | ICD-10-CM

## 2021-05-31 DIAGNOSIS — L578 Other skin changes due to chronic exposure to nonionizing radiation: Secondary | ICD-10-CM

## 2021-05-31 DIAGNOSIS — L820 Inflamed seborrheic keratosis: Secondary | ICD-10-CM | POA: Diagnosis not present

## 2021-05-31 DIAGNOSIS — D1800 Hemangioma unspecified site: Secondary | ICD-10-CM | POA: Diagnosis not present

## 2021-05-31 DIAGNOSIS — Q828 Other specified congenital malformations of skin: Secondary | ICD-10-CM | POA: Diagnosis not present

## 2021-05-31 DIAGNOSIS — L570 Actinic keratosis: Secondary | ICD-10-CM | POA: Diagnosis not present

## 2021-05-31 DIAGNOSIS — L821 Other seborrheic keratosis: Secondary | ICD-10-CM

## 2021-05-31 MED ORDER — FLUOROURACIL 5 % EX CREA: TOPICAL_CREAM | CUTANEOUS | 1 refills | Status: AC

## 2021-05-31 NOTE — Patient Instructions (Addendum)
5-Fluorouracil/Calcipotriene Patient Education   Actinic keratoses are the dry, red scaly spots on the skin caused by sun damage. A portion of these spots can turn into skin cancer with time, and treating them can help prevent development of skin cancer.   Treatment of these spots requires removal of the defective skin cells. There are various ways to remove actinic keratoses, including freezing with liquid nitrogen, treatment with creams, or treatment with a blue light procedure in the office.   5-fluorouracil cream is a topical cream used to treat actinic keratoses. It works by interfering with the growth of abnormal fast-growing skin cells, such as actinic keratoses. These cells peel off and are replaced by healthy ones.   5-fluorouracil/calcipotriene is a combination of the 5-fluorouracil cream with a vitamin D analog cream called calcipotriene. The calcipotriene alone does not treat actinic keratoses. However, when it is combined with 5-fluorouracil, it helps the 5-fluorouracil treat the actinic keratoses much faster so that the same results can be achieved with a much shorter treatment time.  INSTRUCTIONS FOR 5-FLUOROURACIL/CALCIPOTRIENE CREAM:   5-fluorouracil/calcipotriene cream typically only needs to be used for 4-7 days. A thin layer should be applied twice a [place] to the treatment areas recommended by your physician.   If your physician prescribed you separate tubes of 5-fluourouracil and calcipotriene, apply a thin layer of 5-fluorouracil followed by a thin layer of calcipotriene.   Avoid contact with your eyes, nostrils, and mouth. Do not use 5-fluorouracil/calcipotriene cream on infected or open wounds.   You will develop redness, irritation and some crusting at areas where you have pre-cancer damage/actinic keratoses. IF YOU DEVELOP PAIN, BLEEDING, OR SIGNIFICANT CRUSTING, STOP THE TREATMENT EARLY - you have already gotten a good response and the actinic keratoses should clear up  well.  Wash your hands after applying 5-fluorouracil 5% cream on your skin.   A moisturizer or sunscreen with a minimum SPF 30 should be applied each morning.   Once you have finished the treatment, you can apply a thin layer of Vaseline twice a [place] to irritated areas to soothe and calm the areas more quickly. If you experience significant discomfort, contact your physician.  For some patients it is necessary to repeat the treatment for best results.  SIDE EFFECTS: When using 5-fluorouracil/calcipotriene cream, you may have mild irritation, such as redness, dryness, swelling, or a mild burning sensation. This usually resolves within 2 weeks. The more actinic keratoses you have, the more redness and inflammation you can expect during treatment. Eye irritation has been reported rarely. If this occurs, please let us know.  If you have any trouble using this cream, please call the office. If you have any other questions about this information, please do not hesitate to ask me before you leave the office.   Actinic keratoses are precancerous spots that appear secondary to cumulative UV radiation exposure/sun exposure over time. They are chronic with expected duration over 1 year. A portion of actinic keratoses will progress to squamous cell carcinoma of the skin. It is not possible to reliably predict which spots will progress to skin cancer and so treatment is recommended to prevent development of skin cancer.  Recommend daily broad spectrum sunscreen SPF 30+ to sun-exposed areas, reapply every 2 hours as needed.  Recommend staying in the shade or wearing long sleeves, sun glasses (UVA+UVB protection) and wide brim hats (4-inch brim around the entire circumference of the hat). Call for new or changing lesions.   Cryotherapy Aftercare  Wash gently with  soap and water everyday.   Apply Vaseline and Band-Aid daily until healed.          If you have any questions or concerns for your doctor,  please call our main line at [phone] and press option 4 to reach your doctor's medical assistant. If no one answers, please leave a voicemail as directed and we will return your call as soon as possible. Messages left after 4 pm will be answered the following business [place].   You may also send [place] a message via [place]. We typically respond to MyChart messages within 1-2 business days.  For prescription refills, please ask your pharmacy to contact our office. Our fax number is [phone].  If you have an urgent issue when the clinic is closed that cannot wait until the next business [place], you can page your doctor at the number below.    Please note that while we do our best to be available for urgent issues outside of office hours, we are not available 24/7.   If you have an urgent issue and are unable to reach [place], you may choose to seek medical care at your doctor's office, retail clinic, urgent care center, or emergency room.  If you have a medical emergency, please immediately call 911 or go to the emergency department.  Pager Numbers  - Dr. Nehemiah Massed: [phone]  - Dr. Laurence Ferrari: [phone]  - Dr. Nicole Kindred: [phone]  In the event of inclement weather, please call our main line at [phone] for an update on the status of any delays or closures.  Dermatology Medication Tips: Please keep the boxes that topical medications come in in order to help keep track of the instructions about where and how to use these. Pharmacies typically print the medication instructions only on the boxes and not directly on the medication tubes.   If your medication is too expensive, please contact our office at [phone] option 4 or send [place] a message through [place].   We are unable to tell what your co-pay for medications will be in advance as this is different depending on your insurance coverage. However, we may be able to find a substitute medication at lower cost or fill out paperwork to get  insurance to cover a needed medication.   If a prior authorization is required to get your medication covered by your insurance company, please allow [place] 1-2 business days to complete this process.  Drug prices often vary depending on where the prescription is filled and some pharmacies may offer cheaper prices.  The website www.goodrx.com contains coupons for medications through different pharmacies. The prices here do not account for what the cost may be with help from insurance (it may be cheaper with your insurance), but the website can give you the price if you did not use any insurance.  - You can print the associated coupon and take it with your prescription to the pharmacy.  - You may also stop by our office during regular business hours and pick up a GoodRx coupon card.  - If you need your prescription sent electronically to a different pharmacy, notify our office through [facility] or by phone at [phone] option 4.

## 2021-05-31 NOTE — Progress Notes (Signed)
Follow-Up Visit   Subjective  Gadge R Kantz is a 70 y.o. male who presents for the following: Follow-up (Patient here today for follow up on isks and aks. He reports no new spots. Patient reports a spot at right leg. ).  The following portions of the chart were reviewed this encounter and updated as appropriate:  Tobacco  Allergies  Meds  Problems  Med Hx  Surg Hx  Fam Hx     Review of Systems: No other skin or systemic complaints except as noted in HPI or Assessment and Plan.  Objective  Well appearing patient in no apparent distress; mood and affect are within normal limits.  A focused examination was performed including face, arms, legs, scalp, temples. Relevant physical exam findings are noted in the Assessment and Plan.  bilateral temples x 6 (6) Erythematous thin papules/macules with gritty scale.   scalp x 1 Erythematous keratotic or waxy stuck-on papule or plaque.   right distal calf 1.5 cm annulare scaly patch   Assessment & Plan   Actinic keratosis (6) bilateral temples x 6   Actinic keratoses are precancerous spots that appear secondary to cumulative UV radiation exposure/sun exposure over time. They are chronic with expected duration over 1 year. A portion of actinic keratoses will progress to squamous cell carcinoma of the skin. It is not possible to reliably predict which spots will progress to skin cancer and so treatment is recommended to prevent development of skin cancer.  Recommend daily broad spectrum sunscreen SPF 30+ to sun-exposed areas, reapply every 2 hours as needed.  Recommend staying in the shade or wearing long sleeves, sun glasses (UVA+UVB protection) and wide brim hats (4-inch brim around the entire circumference of the hat). Call for new or changing lesions.  - Start 5-fluorouracil/calcipotriene cream twice a [place] for 7 days to affected areas including temples . Prescription sent to [facility]. Patient advised they will  receive an email to purchase the medication online and have it sent to their home. Patient provided with handout reviewing treatment course and side effects and advised to call or message [place] on MyChart with any concerns.  Actinic Damage - Severe, confluent actinic changes with pre-cancerous actinic keratoses  - Severe, chronic, not at goal, secondary to cumulative UV radiation exposure over time - diffuse scaly erythematous macules and papules with underlying dyspigmentation - Discussed Prescription "Field Treatment" for Severe, Chronic Confluent Actinic Changes with Pre-Cancerous Actinic Keratoses Field treatment involves treatment of an entire area of skin that has confluent Actinic Changes (Sun/ Ultraviolet light damage) and PreCancerous Actinic Keratoses by method of PhotoDynamic Therapy (PDT) and/or prescription Topical Chemotherapy agents such as 5-fluorouracil, 5-fluorouracil/calcipotriene, and/or imiquimod.  The purpose is to decrease the number of clinically evident and subclinical PreCancerous lesions to prevent progression to development of skin cancer by chemically destroying early precancer changes that may or may not be visible.  It has been shown to reduce the risk of developing skin cancer in the treated area. As a result of treatment, redness, scaling, crusting, and open sores may occur during treatment course. One or more than one of these methods may be used and may have to be used several times to control, suppress and eliminate the PreCancerous changes. Discussed treatment course, expected reaction, and possible side effects. - Recommend daily broad spectrum sunscreen SPF 30+ to sun-exposed areas, reapply every 2 hours as needed.  - Staying in the shade or wearing long sleeves, sun glasses (UVA+UVB protection) and wide brim hats (  4-inch brim around the entire circumference of the hat) are also recommended. - Call for new or changing lesions.   fluorouracil (EFUDEX) 5 % cream -  bilateral temples x 6 Apply topically See admin instructions. Apply for 7 days to temples  Destruction of lesion - bilateral temples x 6 Complexity: simple   Destruction method: cryotherapy   Informed consent: discussed and consent obtained   Timeout:  patient name, date of birth, surgical site, and procedure verified Lesion destroyed using liquid nitrogen: Yes   Region frozen until ice ball extended beyond lesion: Yes   Outcome: patient tolerated procedure well with no complications   Post-procedure details: wound care instructions given    Inflamed seborrheic keratosis scalp x 1  Destruction of lesion - scalp x 1 Complexity: simple   Destruction method: cryotherapy   Informed consent: discussed and consent obtained   Timeout:  patient name, date of birth, surgical site, and procedure verified Lesion destroyed using liquid nitrogen: Yes   Region frozen until ice ball extended beyond lesion: Yes   Outcome: patient tolerated procedure well with no complications   Post-procedure details: wound care instructions given    Porokeratosis right distal calf  Benign-appearing.  Observation.  Call clinic for new or changing lesions.  Recommend daily use of broad spectrum spf 30+ sunscreen to sun-exposed areas.    Seborrheic Keratoses - Stuck-on, waxy, tan-brown papules and/or plaques  - Benign-appearing - Discussed benign etiology and prognosis. - Observe - Call for any changes  Purpura - Chronic; persistent and recurrent.  Treatable, but not curable. - Violaceous macules and patches - Benign - Related to trauma, age, sun damage and/or use of blood thinners, chronic use of topical and/or oral steroids - Observe - Can use OTC arnica containing moisturizer such as Dermend Bruise Formula if desired - Call for worsening or other concerns  Hemangiomas - Red papules - Discussed benign nature - Observe - Call for any changes  Actinic Damage - chronic, secondary to cumulative UV  radiation exposure/sun exposure over time - diffuse scaly erythematous macules with underlying dyspigmentation - Recommend daily broad spectrum sunscreen SPF 30+ to sun-exposed areas, reapply every 2 hours as needed.  - Recommend staying in the shade or wearing long sleeves, sun glasses (UVA+UVB protection) and wide brim hats (4-inch brim around the entire circumference of the hat). - Call for new or changing lesions  Return in about 6 months (around 11/28/2021) for ak and isk follow up . IRuthell Rummage, CMA, am acting as scribe for Sarina Ser, MD. Documentation: I have reviewed the above documentation for accuracy and completeness, and I agree with the above.  Sarina Ser, MD

## 2021-06-02 ENCOUNTER — Encounter: Payer: Self-pay | Admitting: Dermatology

## 2021-12-01 ENCOUNTER — Ambulatory Visit: Payer: Managed Care, Other (non HMO) | Admitting: Dermatology

## 2022-02-28 ENCOUNTER — Telehealth: Payer: Self-pay

## 2022-02-28 NOTE — Telephone Encounter (Signed)
Pt called requesting refill of [facility] rosacea triple cream, okay refills [facility].

## 2022-05-09 ENCOUNTER — Ambulatory Visit: Payer: Managed Care, Other (non HMO) | Admitting: Dermatology

## 2022-05-09 ENCOUNTER — Encounter: Payer: Self-pay | Admitting: Dermatology

## 2022-05-09 DIAGNOSIS — D692 Other nonthrombocytopenic purpura: Secondary | ICD-10-CM | POA: Diagnosis not present

## 2022-05-09 DIAGNOSIS — L570 Actinic keratosis: Secondary | ICD-10-CM

## 2022-05-09 DIAGNOSIS — L814 Other melanin hyperpigmentation: Secondary | ICD-10-CM

## 2022-05-09 DIAGNOSIS — D229 Melanocytic nevi, unspecified: Secondary | ICD-10-CM

## 2022-05-09 DIAGNOSIS — L578 Other skin changes due to chronic exposure to nonionizing radiation: Secondary | ICD-10-CM | POA: Diagnosis not present

## 2022-05-09 DIAGNOSIS — L821 Other seborrheic keratosis: Secondary | ICD-10-CM | POA: Diagnosis not present

## 2022-05-09 NOTE — Patient Instructions (Addendum)
Cryotherapy Aftercare  Wash gently with soap and water everyday.   Apply Vaseline daily until healed.   Recommend daily broad spectrum sunscreen SPF 30+ to sun-exposed areas, reapply every 2 hours as needed. Call for new or changing lesions.  Staying in the shade or wearing long sleeves, sun glasses (UVA+UVB protection) and wide brim hats (4-inch brim around the entire circumference of the hat) are also recommended for sun protection.     Due to recent changes in healthcare laws, you may see results of your pathology and/or laboratory studies on MyChart before the doctors have had a chance to review them. We understand that in some cases there may be results that are confusing or concerning to you. Please understand that not all results are received at the same time and often the doctors may need to interpret multiple results in order to provide you with the best plan of care or course of treatment. Therefore, we ask that you please give us 2 business days to thoroughly review all your results before contacting the office for clarification. Should we see a critical lab result, you will be contacted sooner.   If You Need Anything After Your Visit  If you have any questions or concerns for your doctor, please call our main line at [phone] and press option 4 to reach your doctor's medical assistant. If no one answers, please leave a voicemail as directed and we will return your call as soon as possible. Messages left after 4 pm will be answered the following business [place].   You may also send us a message via MyChart. We typically respond to MyChart messages within 1-2 business days.  For prescription refills, please ask your pharmacy to contact our office. Our fax number is [phone].  If you have an urgent issue when the clinic is closed that cannot wait until the next business [place], you can page your doctor at the number below.    Please note that while we do our best to be available for  urgent issues outside of office hours, we are not available 24/7.   If you have an urgent issue and are unable to reach us, you may choose to seek medical care at your doctor's office, retail clinic, urgent care center, or emergency room.  If you have a medical emergency, please immediately call 911 or go to the emergency department.  Pager Numbers  - Dr. Kowalski: [phone]  - Dr. Moye: [phone]  - Dr. Stewart: [phone]  In the event of inclement weather, please call our main line at [phone] for an update on the status of any delays or closures.  Dermatology Medication Tips: Please keep the boxes that topical medications come in in order to help keep track of the instructions about where and how to use these. Pharmacies typically print the medication instructions only on the boxes and not directly on the medication tubes.   If your medication is too expensive, please contact our office at [phone] option 4 or send us a message through MyChart.   We are unable to tell what your co-pay for medications will be in advance as this is different depending on your insurance coverage. However, we may be able to find a substitute medication at lower cost or fill out paperwork to get insurance to cover a needed medication.   If a prior authorization is required to get your medication covered by your insurance company, please allow us 1-2 business days to complete this process.  Drug prices   often vary depending on where the prescription is filled and some pharmacies may offer cheaper prices.  The website www.goodrx.com contains coupons for medications through different pharmacies. The prices here do not account for what the cost may be with help from insurance (it may be cheaper with your insurance), but the website can give you the price if you did not use any insurance.  - You can print the associated coupon and take it with your prescription to the pharmacy.  - You may also  stop by our office during regular business hours and pick up a GoodRx coupon card.  - If you need your prescription sent electronically to a different pharmacy, notify our office through [place] MyChart or by phone at [phone] option 4.     Si Usted Necesita Algo Despus de Su Visita  Tambin puede enviarnos un mensaje a travs de MyChart. Por lo general respondemos a los mensajes de MyChart en el transcurso de 1 a 2 das hbiles.  Para renovar recetas, por favor pida a su farmacia que se ponga en contacto con nuestra oficina. Nuestro nmero de fax es el [phone].  Si tiene un asunto urgente cuando la clnica est cerrada y que no puede esperar hasta el siguiente da hbil, puede llamar/localizar a su doctor(a) al nmero que aparece a continuacin.   Por favor, tenga en cuenta que aunque hacemos todo lo posible para estar disponibles para asuntos urgentes fuera del horario de oficina, no estamos disponibles las 24 horas del da, los 7 das de la semana.   Si tiene un problema urgente y no puede comunicarse con nosotros, puede optar por buscar atencin mdica  en el consultorio de su doctor(a), en una clnica privada, en un centro de atencin urgente o en una sala de emergencias.  Si tiene una emergencia mdica, por favor llame inmediatamente al 911 o vaya a la sala de emergencias.  Nmeros de bper  - Dr. Kowalski: [phone]  - Dra. Moye: [phone]  - Dra. Stewart: [phone]  En caso de inclemencias del tiempo, por favor llame a nuestra lnea principal al [phone] para una actualizacin sobre el estado de cualquier retraso o cierre.  Consejos para la medicacin en dermatologa: Por favor, guarde las cajas en las que vienen los medicamentos de uso tpico para ayudarle a seguir las instrucciones sobre dnde y cmo usarlos. Las farmacias generalmente imprimen las instrucciones del medicamento slo en las cajas y no directamente en los tubos del medicamento.   Si  su medicamento es muy caro, por favor, pngase en contacto con nuestra oficina llamando al [phone] y presione la opcin 4 o envenos un mensaje a travs de MyChart.   No podemos decirle cul ser su copago por los medicamentos por adelantado ya que esto es diferente dependiendo de la cobertura de su seguro. Sin embargo, es posible que podamos encontrar un medicamento sustituto a menor costo o llenar un formulario para que el seguro cubra el medicamento que se considera necesario.   Si se requiere una autorizacin previa para que su compaa de seguros cubra su medicamento, por favor permtanos de 1 a 2 das hbiles para completar este proceso.  Los precios de los medicamentos varan con frecuencia dependiendo del lugar de dnde se surte la receta y alguna farmacias pueden ofrecer precios ms baratos.  El sitio web www.goodrx.com tiene cupones para medicamentos de diferentes farmacias. Los precios aqu no tienen en cuenta lo que podra costar con la ayuda del seguro (puede ser ms barato con   su seguro), pero el sitio web puede darle el precio si no utiliz ningn seguro.  - Puede imprimir el cupn correspondiente y llevarlo con su receta a la farmacia.  - Tambin puede pasar por nuestra oficina durante el horario de atencin regular y recoger una tarjeta de cupones de GoodRx.  - Si necesita que su receta se enve electrnicamente a una farmacia diferente, informe a nuestra oficina a travs de MyChart de [place] o por telfono llamando al [phone] y presione la opcin 4.  

## 2022-05-09 NOTE — Progress Notes (Unsigned)
Follow-Up Visit   Subjective  Richard Dean is a 71 y.o. male who presents for the following: Actinic Keratosis (Recheck. Temples. Tx with LN2 and 5FU/Calcipotriene treatment since last visit). The patient has spots, moles and lesions to be evaluated, some may be new or changing and the patient has concerns that these could be cancer.  The following portions of the chart were reviewed this encounter and updated as appropriate:  Tobacco  Allergies  Meds  Problems  Med Hx  Surg Hx  Fam Hx     Review of Systems: No other skin or systemic complaints except as noted in HPI or Assessment and Plan.  Objective  Well appearing patient in no apparent distress; mood and affect are within normal limits.  All skin waist up examined.  scalp, face, ears x10 (10) Erythematous thin papules/macules with gritty scale.    Assessment & Plan   Actinic Damage - chronic, secondary to cumulative UV radiation exposure/sun exposure over time - diffuse scaly erythematous macules with underlying dyspigmentation - Recommend daily broad spectrum sunscreen SPF 30+ to sun-exposed areas, reapply every 2 hours as needed.  - Recommend staying in the shade or wearing long sleeves, sun glasses (UVA+UVB protection) and wide brim hats (4-inch brim around the entire circumference of the hat). - Call for new or changing lesions.  Seborrheic Keratoses - Stuck-on, waxy, tan-brown papules and/or plaques  - Benign-appearing - Discussed benign etiology and prognosis. - Observe - Call for any changes  Purpura - Chronic; persistent and recurrent.  Treatable, but not curable. Arms. - Violaceous macules and patches - Benign - Related to trauma, age, sun damage and/or use of blood thinners, chronic use of topical and/or oral steroids - Observe - Can use OTC arnica containing moisturizer such as Dermend Bruise Formula if desired - Call for worsening or other concerns  Lentigines - Scattered tan macules - Due to  sun exposure - Benign-appering, observe - Recommend daily broad spectrum sunscreen SPF 30+ to sun-exposed areas, reapply every 2 hours as needed. - Call for any changes  Melanocytic Nevi - Tan-brown and/or pink-flesh-colored symmetric macules and papules - Benign appearing on exam today - Observation - Call clinic for new or changing moles - Recommend daily use of broad spectrum spf 30+ sunscreen to sun-exposed areas.   AK (actinic keratosis) (10) scalp, face, ears x10  Actinic keratoses are precancerous spots that appear secondary to cumulative UV radiation exposure/sun exposure over time. They are chronic with expected duration over 1 year. A portion of actinic keratoses will progress to squamous cell carcinoma of the skin. It is not possible to reliably predict which spots will progress to skin cancer and so treatment is recommended to prevent development of skin cancer.  Recommend daily broad spectrum sunscreen SPF 30+ to sun-exposed areas, reapply every 2 hours as needed.  Recommend staying in the shade or wearing long sleeves, sun glasses (UVA+UVB protection) and wide brim hats (4-inch brim around the entire circumference of the hat). Call for new or changing lesions.  Destruction of lesion - scalp, face, ears x10 Complexity: simple   Destruction method: cryotherapy   Informed consent: discussed and consent obtained   Timeout:  patient name, date of birth, surgical site, and procedure verified Lesion destroyed using liquid nitrogen: Yes   Region frozen until ice ball extended beyond lesion: Yes   Outcome: patient tolerated procedure well with no complications   Post-procedure details: wound care instructions given   Additional details:  Prior to procedure,  discussed risks of blister formation, small wound, skin dyspigmentation, or rare scar following cryotherapy. Recommend Vaseline ointment to treated areas while healing.    Return in about 1 year (around 05/10/2023) for UBSE,  AK Follow Up.  I, Emelia Salisbury, CMA, am acting as scribe for Sarina Ser, MD. Documentation: I have reviewed the above documentation for accuracy and completeness, and I agree with the above.  Sarina Ser, MD

## 2022-05-10 ENCOUNTER — Encounter: Payer: Self-pay | Admitting: Dermatology

## 2022-08-31 IMAGING — US US EXTREM LOW VENOUS
1 series · 14 of 24 positions shown · non-contrast
Comparison: None.

CLINICAL DATA: Lower extremity swelling

EXAM:
BILATERAL LOWER EXTREMITY VENOUS DOPPLER ULTRASOUND
TECHNIQUE: Gray-scale sonography with compression, as well as color and duplex
ultrasound, were performed to evaluate the deep venous system(s)
from the level of the common femoral vein through the popliteal and
proximal calf veins.

[Series 1: venous difficult us · portal-venous · 14 of 78 slices shown]
[im 1/78]
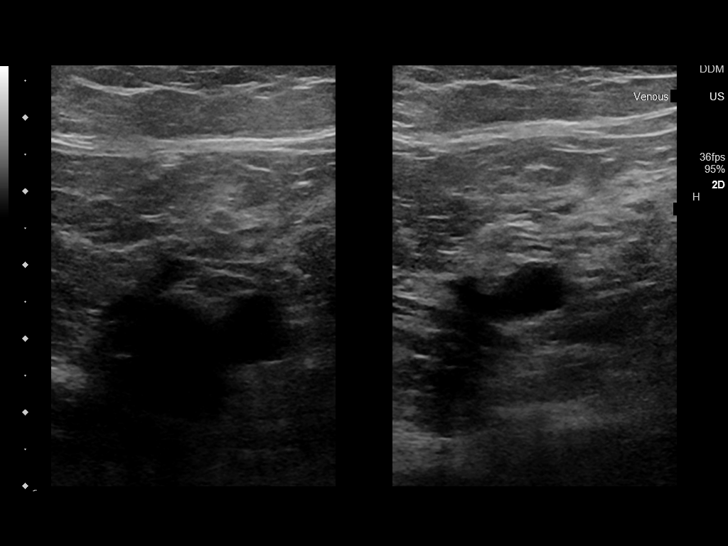
[im 7/78]
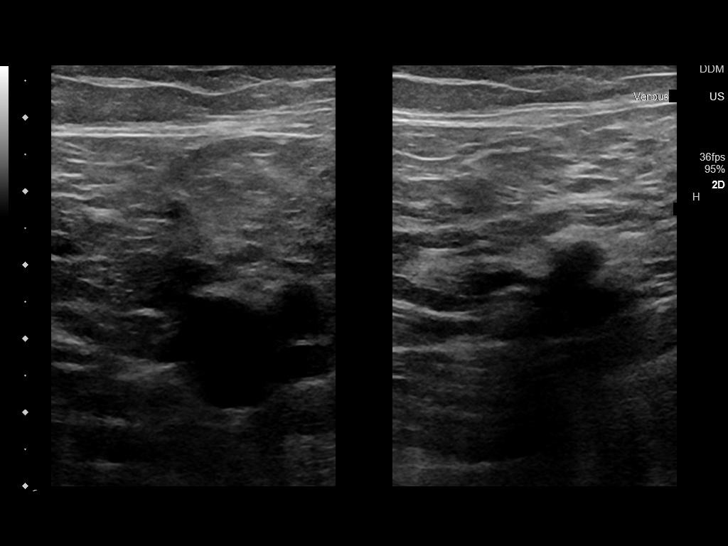
[im 14/78]
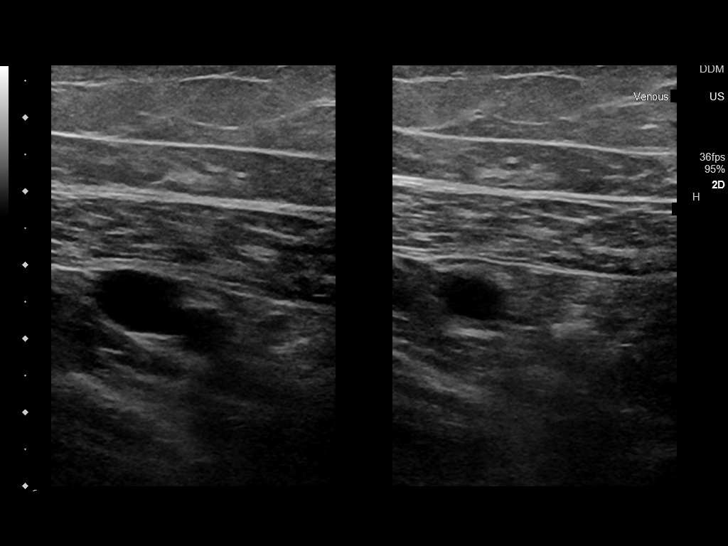
[im 21/78]
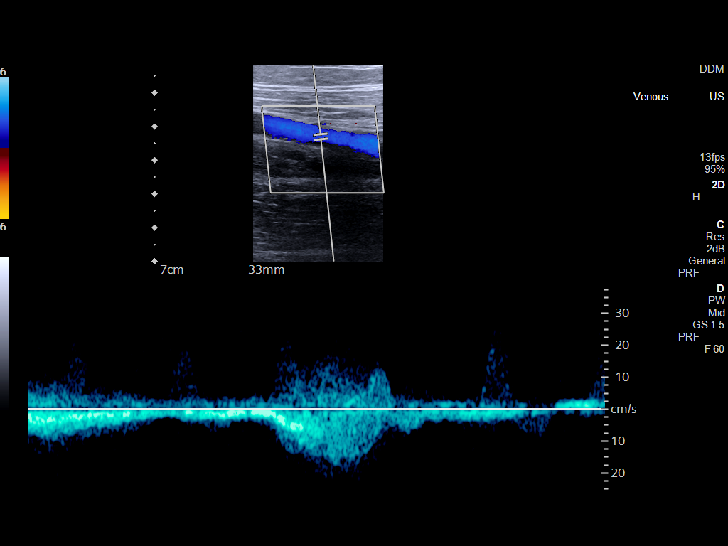
[im 24/78]
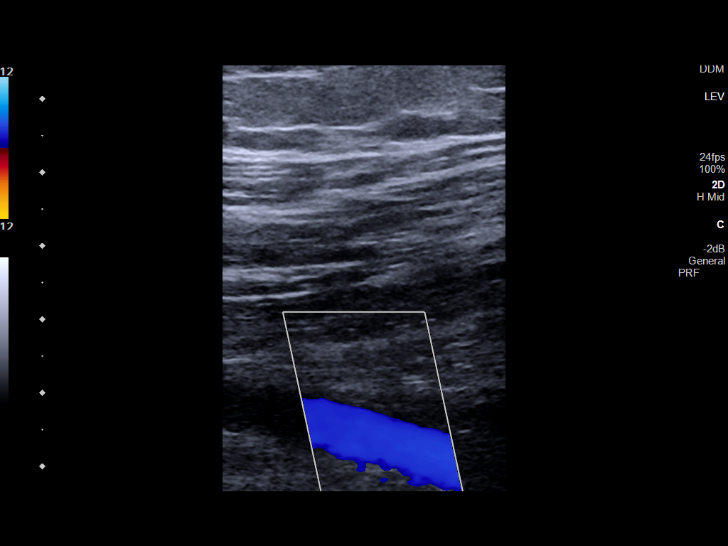
[im 31/78]
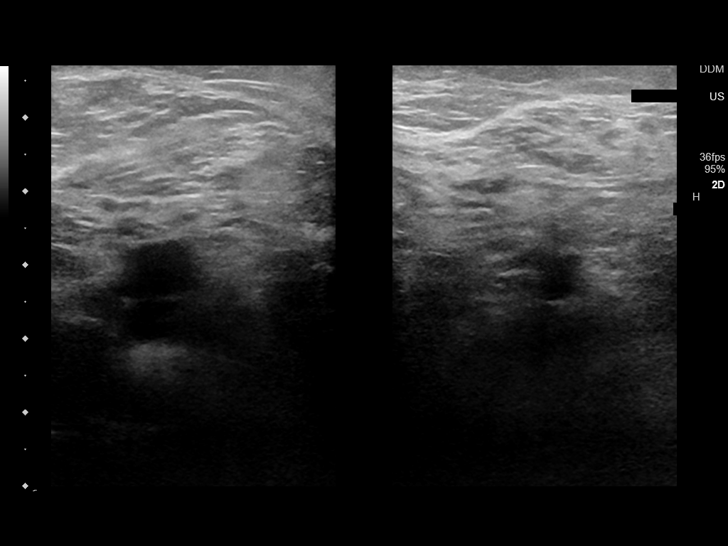
[im 37/78]
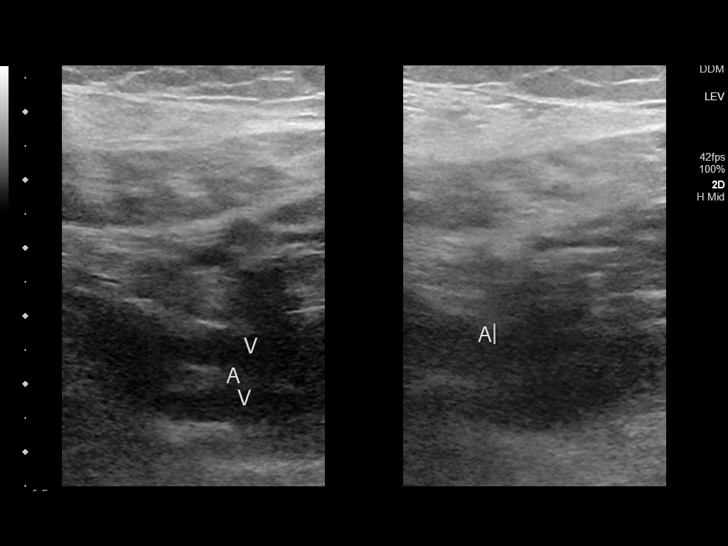
[im 41/78]
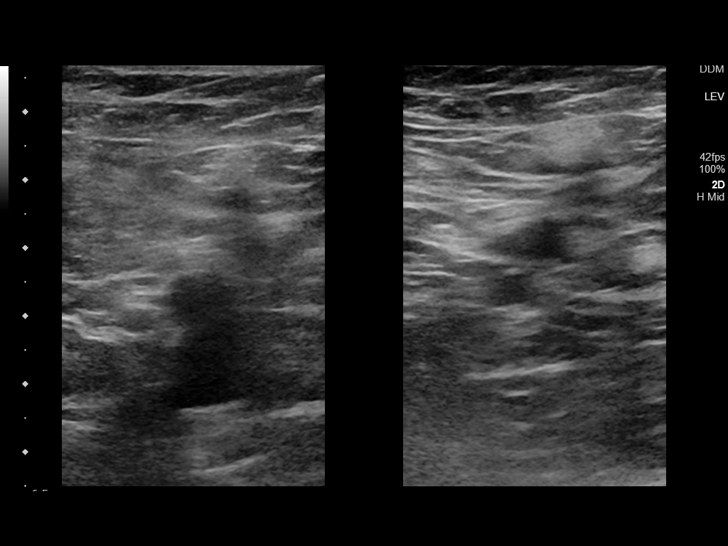
[im 47/78]
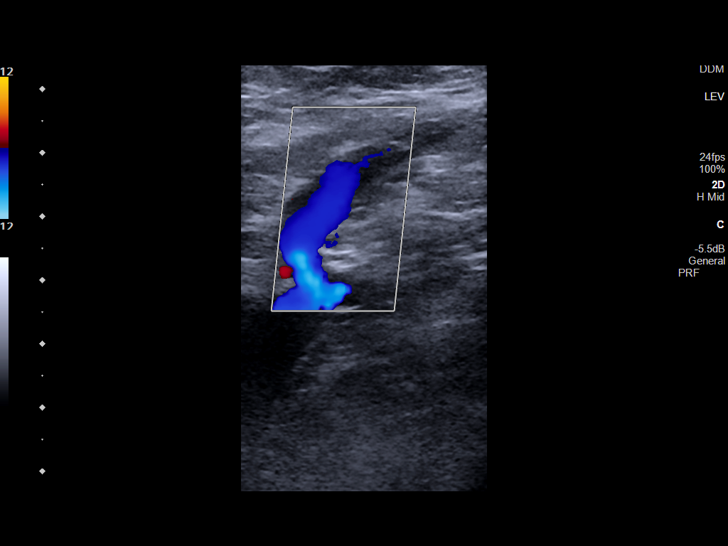
[im 54/78]
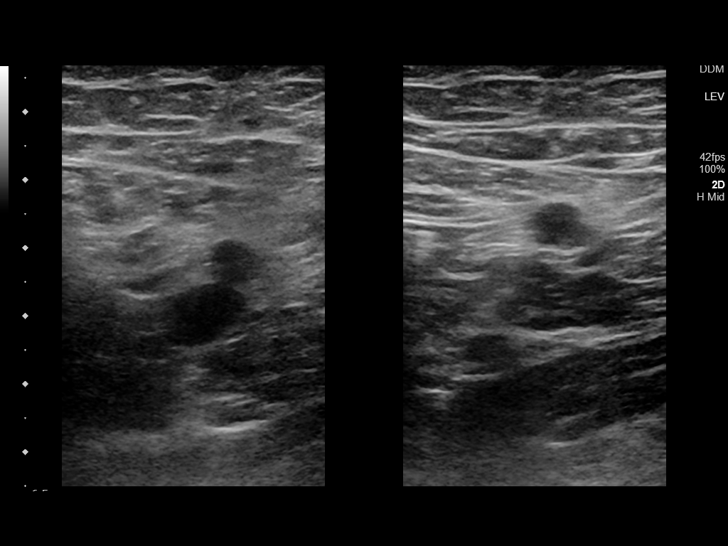
[im 61/78]
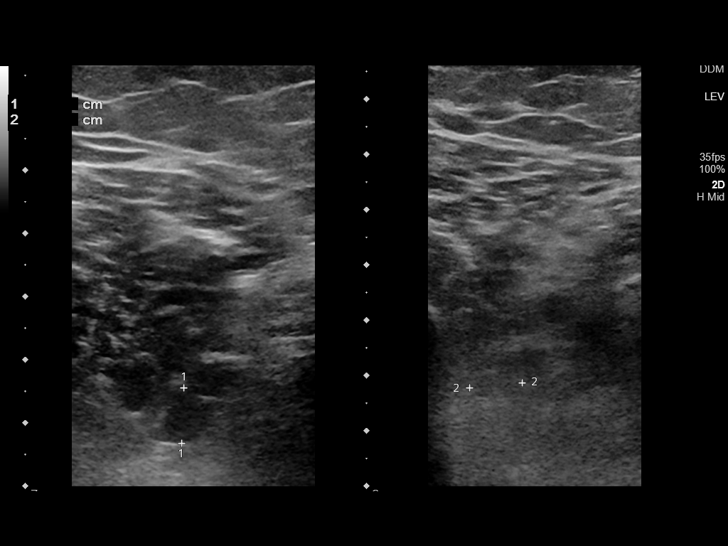
[im 64/78]
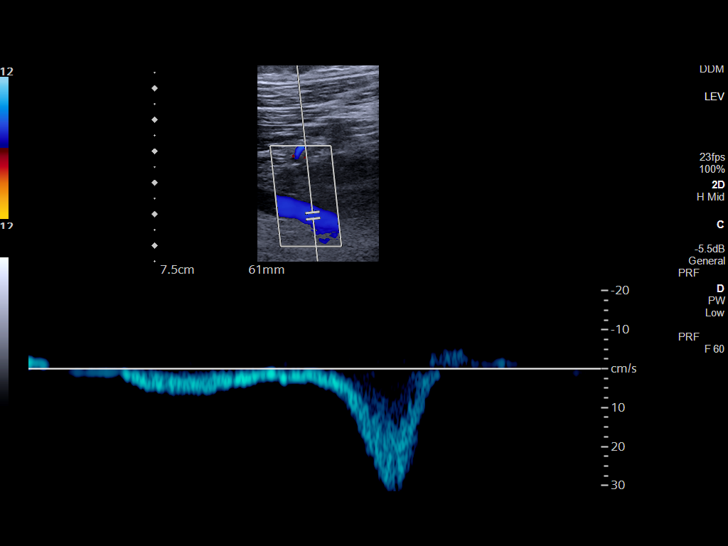
[im 71/78]
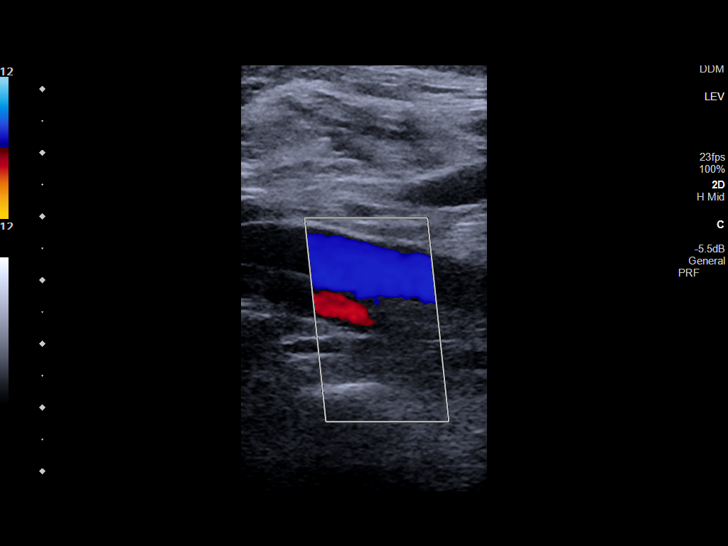
[im 78/78]
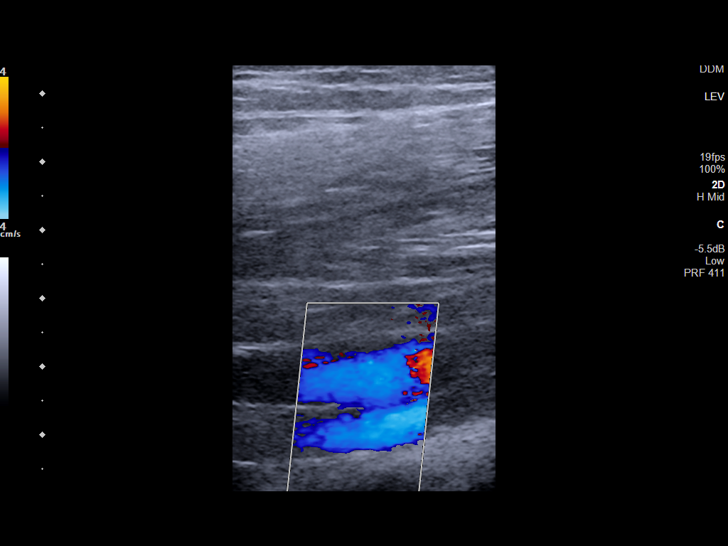

[14 of 24 positions shown; findings below may reference images not displayed]

FINDINGS: VENOUS

Normal compressibility of the common femoral, superficial femoral,
and popliteal veins, as well as the visualized calf veins.
Visualized portions of profunda femoral vein and great saphenous
vein unremarkable. No filling defects to suggest DVT on grayscale or
color Doppler imaging. Doppler waveforms show normal direction of
venous flow, normal respiratory plasticity and response to
augmentation.

Limited views of the contralateral common femoral vein are
unremarkable.

OTHER

None.

Limitations: none
IMPRESSION: Negative.

## 2022-08-31 IMAGING — CR DG CHEST 2V
1 series · 2 of 2 positions shown · non-contrast
Comparison: None.

CLINICAL DATA: Cough, shortness of breath, [id number] positive

EXAM:
CHEST - 2 VIEW

[Series 1: w chest pa · 0.14mm/px · 2 of 2 slices shown]
[im 1/2]
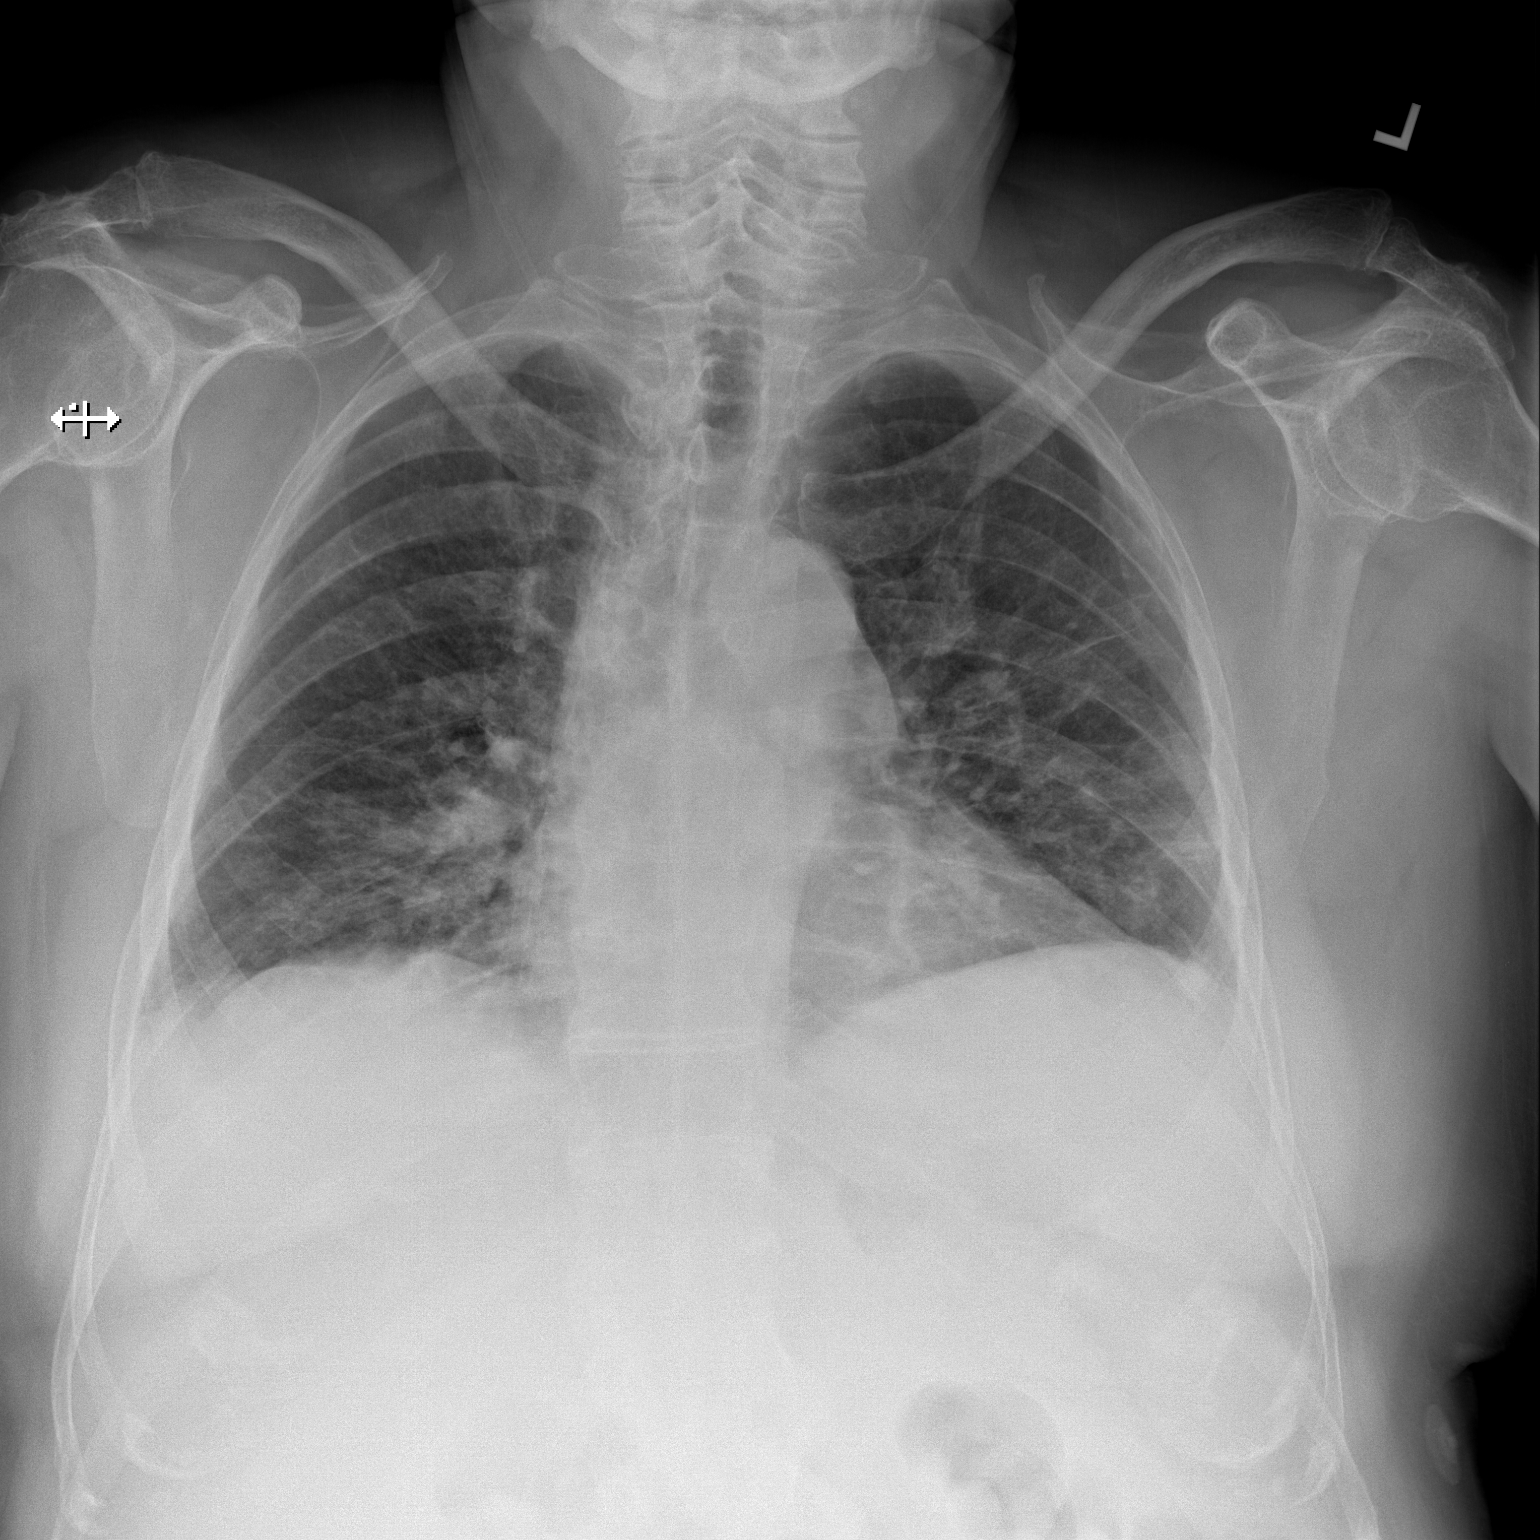
[im 2/2]
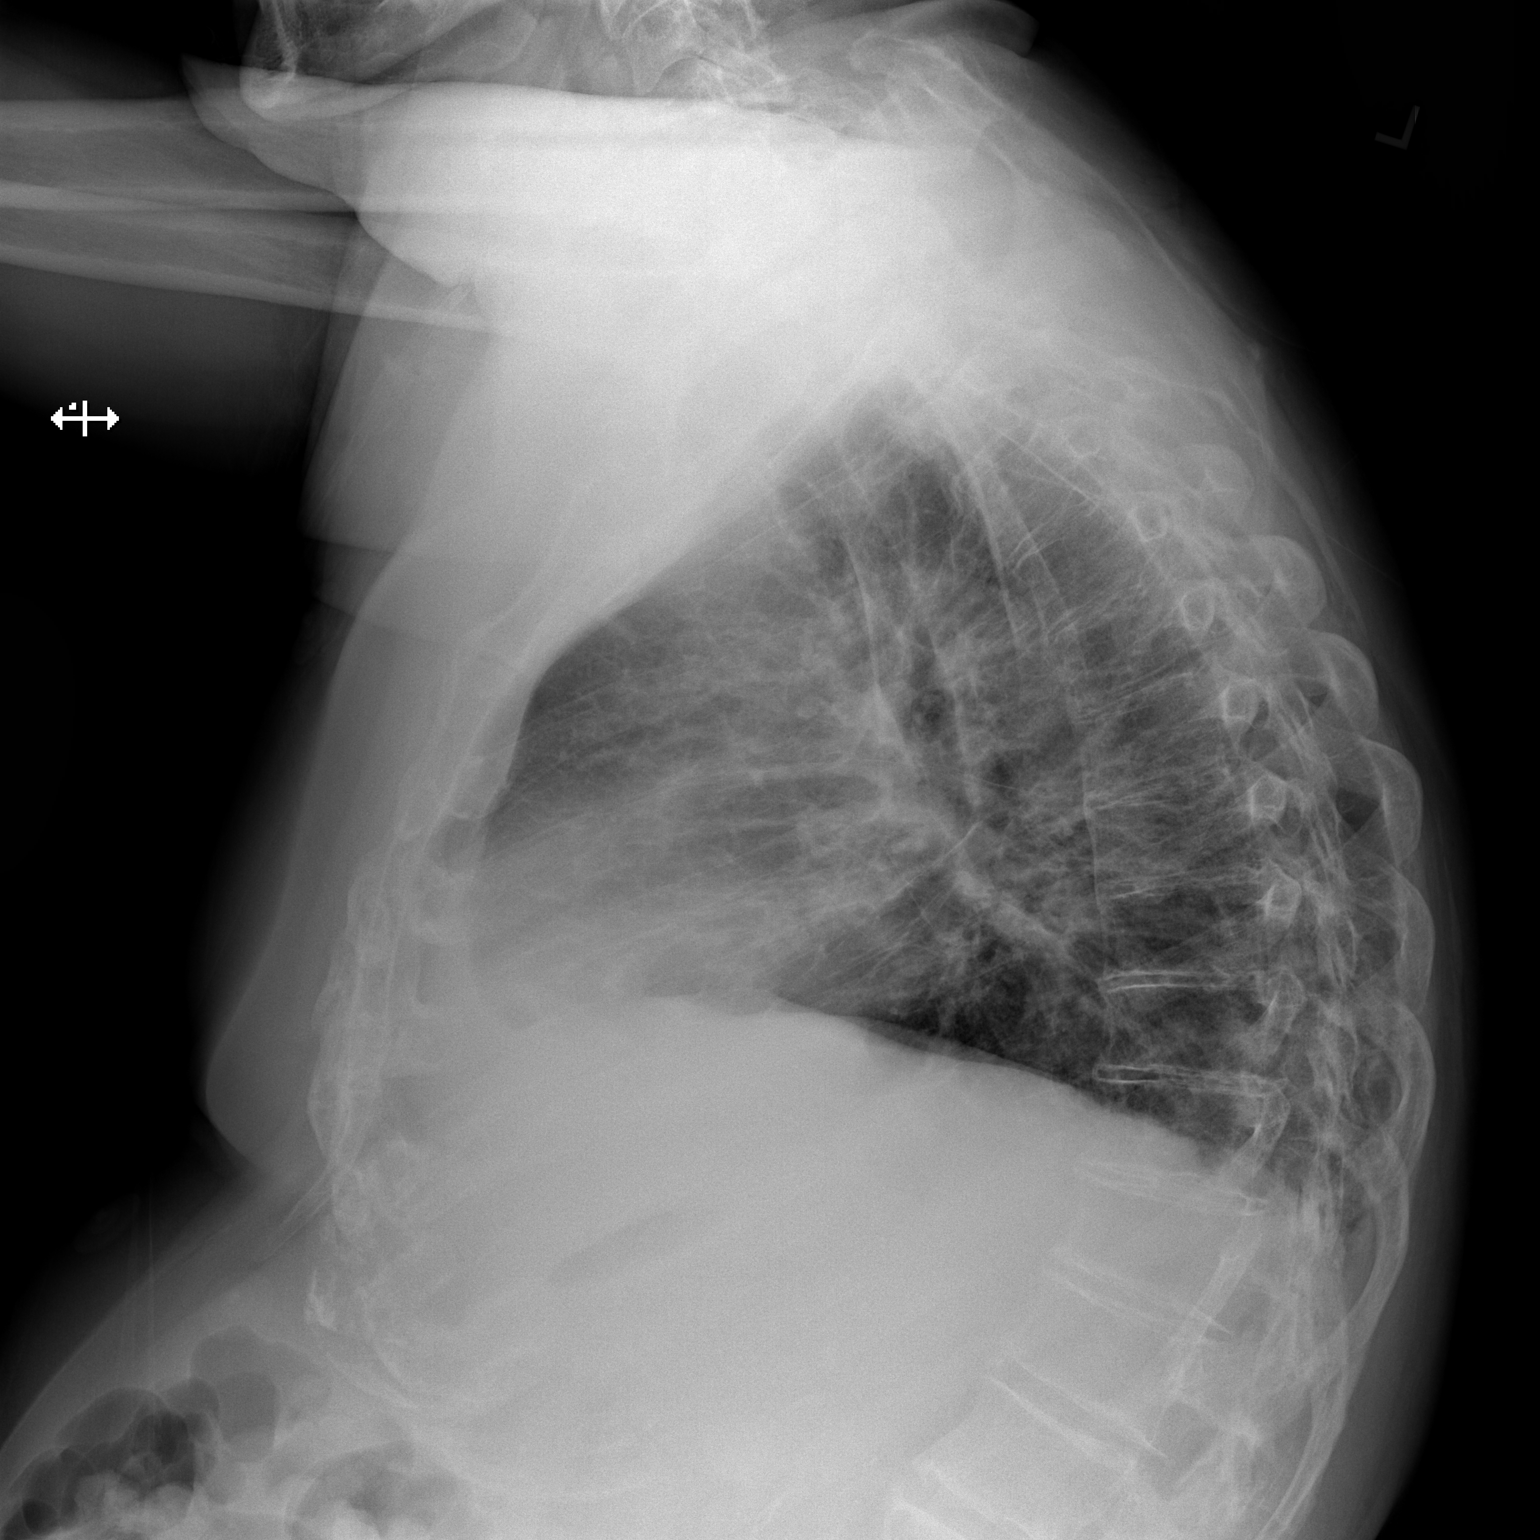

[2 of 2 positions shown; findings below may reference images not displayed]

FINDINGS: The heart size and mediastinal contours are within normal limits.
Low lung volumes. Streaky interstitial opacities within the mid to
lower lung fields bilaterally. No pleural effusion or pneumothorax.
Degenerative changes of the shoulders and spine.
IMPRESSION: Streaky interstitial opacities within the mid to lower lung fields
bilaterally. Findings suspicious for atypical/viral infection.

## 2023-04-15 ENCOUNTER — Ambulatory Visit: Payer: Self-pay

## 2023-04-15 VITALS — BP 138/81 | HR 77 | Temp 98.1°F | Resp 18 | Wt 230.0 lb

## 2023-04-15 DIAGNOSIS — M545 Low back pain, unspecified: Secondary | ICD-10-CM

## 2023-04-15 MED ORDER — BACLOFEN 10 MG PO TABS: 10.0000 mg | ORAL_TABLET | Freq: Three times a day (TID) | ORAL | 0 refills | Status: AC

## 2023-04-15 MED ORDER — PREDNISONE 20 MG PO TABS: 20.0000 mg | ORAL_TABLET | Freq: Every day | ORAL | 0 refills | Status: DC

## 2023-04-15 MED ORDER — BACLOFEN 10 MG PO TABS: 10.0000 mg | ORAL_TABLET | Freq: Three times a day (TID) | ORAL | 0 refills | Status: DC

## 2023-04-15 MED ORDER — PREDNISONE 20 MG PO TABS: 20.0000 mg | ORAL_TABLET | Freq: Every day | ORAL | 0 refills | Status: AC

## 2023-04-15 NOTE — ED Triage Notes (Signed)
flank pain left 14 days. No injury. Urinating ok. Hurts more in the morning. Patient also has questions regarding his limbs itching in the middle of the night. He asked if it was from taking Celebrex or poor circulation.

## 2023-04-15 NOTE — Discharge Instructions (Addendum)
If the muscle relaxer make you sleepy, then take it only at night time Call your primary care doctor for follow up next week in case you are not better, then they can order physical therapy.

## 2023-04-15 NOTE — ED Provider Notes (Signed)
[facility]    CSN: [id number] Arrival date & time: 04/15/23  1113      History   Chief Complaint Chief Complaint  Patient presents with   Abdominal Pain    Entered by patient    HPI Richard Dean is a 72 y.o. male who presents with L f\lank pain x 2 weeks. Denies UTI symptoms or injury. Huts him more in the am and provoked with movement of spine. Denies fever. Takes Celebrex for Knee arthritis and gets intermittent itching in different areas of body, but no rash.  The pain does not radiate. Denies abdominal pain.      Past Medical History:  Diagnosis Date   Actinic keratosis 07/24/2008   Right sup. temple.    Basal cell carcinoma 05/21/2020   ant scalp near forehead   BPH without obstruction/lower urinary tract symptoms    Dysplastic nevus 01/05/2007   Left ant. shoulder. Moderately severe melanocytic atypia, close to margin. Excised 01/24/2007, margins free.   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   Sleep apnea 09/09/2016   GERD (gastroesophageal reflux disease) 05/09/2016   Anemia 05/09/2016   Abnormal feces    Iron deficiency anemia, unspecified    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   Osteoarthritis 11/10/2015   Essential hypertension 05/05/2015   Hyperlipidemia 05/05/2015   BPH with obstruction/lower urinary tract symptoms 03/30/2015    Past Surgical History:  Procedure Laterality Date   BASAL CELL CARCINOMA EXCISION     Head   CARPAL  TUNNEL RELEASE Right 03/14/2019   Procedure: CARPAL TUNNEL RELEASE ENDOSCOPIC RIGHT;  Surgeon: Christena Flake, MD;  Location: [facility];  Service: Orthopedics;  Laterality: Right;   circumscision     COLONOSCOPY WITH PROPOFOL N/A 12/10/2015   Procedure: COLONOSCOPY WITH PROPOFOL;  Surgeon: Midge Minium, MD;  Location: [facility];  Service: Endoscopy;  Laterality: N/A;   spider bite Left arm   major reconstructive surgery for gangrene   TOTAL KNEE ARTHROPLASTY Left 02/27/2020   Procedure: TOTAL KNEE ARTHROPLASTY;  Surgeon: Christena Flake, MD;  Location: [facility];  Service: Orthopedics;  Laterality: Left;       Home Medications    Prior to Admission medications   Medication Sig Start Date End Date Taking? Authorizing Provider  amLODipine (NORVASC) 5 MG tablet Take 5 mg by mouth at bedtime.   Yes [provider]  aspirin EC 81 MG tablet Take 81 mg by mouth daily.   Yes [provider]  celecoxib (CELEBREX) 200 MG capsule Take 200 mg by mouth 2 (two) times daily.   Yes [provider]  donepezil (ARICEPT) 5 MG tablet Take 5 mg by mouth at bedtime.   Yes [provider]  losartan (COZAAR) 100 MG tablet Take 100 mg by mouth daily.   Yes [provider]  omeprazole (PRILOSEC) 40 MG capsule Take 40 mg by  mouth daily.   Yes [provider]  potassium chloride (KLOR-CON) 10 MEQ tablet Take 10 mEq by mouth 2 (two) times daily. 09/16/20  Yes [provider]  acetaminophen (TYLENOL) 325 MG tablet Take 2 tablets (650 mg total) by mouth every 6 (six) hours as needed for mild pain or fever (or Fever >/= 101). 09/25/20   Tresa Moore, MD  albuterol (VENTOLIN HFA) 108 (90 Base) MCG/ACT inhaler Inhale 2 puffs into the lungs every 6 (six) hours as needed for wheezing or shortness of breath. 09/25/20   Tresa Moore, MD  AMBULATORY NON FORMULARY MEDICATION Medication Name: Azelaic Acid 15%, Metronidazole 1%, Ivermectin 1% Cream (Skin  Medicinals) 05/21/20   Deirdre Evener, MD  baclofen (LIORESAL) 10 MG tablet Take 1 tablet (10 mg total) by mouth 3 (three) times daily. 04/15/23   Rodriguez-Southworth, Nettie Elm, PA-C  Bee Pollen 580 MG CAPS Take 1 capsule by mouth daily.    [provider]  bumetanide (BUMEX) 1 MG tablet Take 1 mg by mouth daily.  10/31/16 02/13/20  [provider]  fluorouracil (EFUDEX) 5 % cream Apply topically See admin instructions. Apply for 7 days to temples 05/31/21   Deirdre Evener, MD  olmesartan (BENICAR) 20 MG tablet Take 20 mg by mouth daily.    [provider]  pantoprazole (PROTONIX) 40 MG tablet Take 40 mg by mouth daily.  10/31/16 02/13/20  [provider]  predniSONE (DELTASONE) 20 MG tablet Take 1 tablet (20 mg total) by mouth daily with breakfast. 04/15/23   Rodriguez-Southworth, Nettie Elm, PA-C  tamsulosin (FLOMAX) 0.4 MG CAPS capsule Take 0.4 mg by mouth daily.    [provider]    Family History Family History  Problem Relation Age of Onset   Hypertension Mother    Cancer Father        lung   Heart disease Father        MI   Hypertension Brother    Allergies Daughter    Hypertension Son    Thyroid disease Son    Hypertension Maternal Grandmother    Hypertension Maternal Grandfather    Hypertension Brother    Kidney disease Neg Hx    Prostate cancer Neg Hx     Social History Social History   Tobacco Use   Smoking status: Former    Current packs/Rascon: 0.00    Types: Cigarettes    Quit date: 03/30/1995    Years since quitting: 28.0   Smokeless tobacco: Never  Vaping Use   Vaping status: Never Used  Substance Use Topics   Alcohol use: No    Alcohol/week: 0.0 standard drinks of alcohol   Drug use: No     Allergies   Ciprofloxacin   Review of Systems Review of Systems  As noted in HPI Physical Exam Triage Vital Signs ED Triage Vitals  Encounter Vitals Group     BP 04/15/23 1124 138/81     Systolic BP Percentile --       Diastolic BP Percentile --      Pulse Rate 04/15/23 1124 77     Resp 04/15/23 1124 18     Temp 04/15/23 1124 98.1 F (36.7 C)     Temp src --      SpO2 04/15/23 1124 95 %     Weight 04/15/23 1122 230 lb (104.3 kg)     Height --      Head Circumference --      Peak Flow --  Pain Score 04/15/23 1121 7     Pain Loc --      Pain Education --      Exclude from Growth Chart --    No data found.  Updated Vital Signs BP 138/81 (BP Location: Right Arm)   Pulse 77   Temp 98.1 F (36.7 C)   Resp 18   Wt 230 lb (104.3 kg)   SpO2 95%   BMI 33.97 kg/m   Visual Acuity Right Eye Distance:   Left Eye Distance:   Bilateral Distance:    Right Eye Near:   Left Eye Near:    Bilateral Near:     Physical Exam Vitals and nursing note reviewed.  Constitutional:      General: He is not in acute distress.    Appearance: He is not toxic-appearing.  HENT:     Right Ear: External ear normal.     Left Ear: External ear normal.  Eyes:     General: No scleral icterus.    Conjunctiva/sclera: Conjunctivae normal.  Pulmonary:     Effort: Pulmonary effort is normal.  Abdominal:     General: Bowel sounds are normal.     Palpations: Abdomen is soft. There is no mass.     Tenderness: There is no abdominal tenderness. There is no right CVA tenderness, left CVA tenderness or guarding.  Musculoskeletal:        General: Normal range of motion.     Comments: BACK- has local tenderness on L mid flank region with palpation and right above iliac crest laterally. This pain is reproduced with spine range of motion laterally and R rotation. Has neg SLR  Skin:    General: Skin is warm and dry.  Neurological:     Mental Status: He is alert and oriented to person, place, and time.     Motor: No weakness.     Gait: Gait normal.     Deep Tendon Reflexes: Reflexes normal.  Psychiatric:        Mood and Affect: Mood normal.        Behavior: Behavior normal.        Thought Content: Thought content  normal.        Judgment: Judgment normal.      UC Treatments / Results  Labs (all labs ordered are listed, but only abnormal results are displayed) Labs Reviewed - No data to display  EKG   Radiology No results found.  Procedures Procedures (including critical care time)  Medications Ordered in UC Medications - No data to display  Initial Impression / Assessment and Plan / UC Course  I have reviewed the triage vital signs and the nursing notes.  Lumbago  I placed him on Baclofen and Prednisone as noted. Needs to d/c the Celebrex while on the prednisone.  Needs to Fu with PCP next week      Final Clinical Impressions(s) / UC Diagnoses   Final diagnoses:  Left lumbar pain     Discharge Instructions      If the muscle relaxer make you sleepy, then take it only at night time Call your primary care doctor for follow up next week in case you are not better, then they can order physical therapy.      ED Prescriptions     Medication Sig Dispense Auth. Provider   predniSONE (DELTASONE) 20 MG tablet  (Status: Discontinued) Take 1 tablet (20 mg total) by mouth daily with breakfast. 5 tablet Rodriguez-Southworth, Nettie Elm, PA-C  baclofen (LIORESAL) 10 MG tablet  (Status: Discontinued) Take 1 tablet (10 mg total) by mouth 3 (three) times daily. 30 each Rodriguez-Southworth, Nettie Elm, PA-C   baclofen (LIORESAL) 10 MG tablet Take 1 tablet (10 mg total) by mouth 3 (three) times daily. 30 each Rodriguez-Southworth, Nettie Elm, PA-C   predniSONE (DELTASONE) 20 MG tablet Take 1 tablet (20 mg total) by mouth daily with breakfast. 5 tablet Rodriguez-Southworth, Nettie Elm, PA-C      PDMP not reviewed this encounter.   Garey Ham, [place] 04/16/23 1209

## 2023-05-24 ENCOUNTER — Ambulatory Visit: Payer: Medicare HMO | Admitting: Dermatology

## 2023-05-24 VITALS — BP 137/62 | HR 73

## 2023-05-24 DIAGNOSIS — L570 Actinic keratosis: Secondary | ICD-10-CM

## 2023-05-24 DIAGNOSIS — Z85828 Personal history of other malignant neoplasm of skin: Secondary | ICD-10-CM

## 2023-05-24 DIAGNOSIS — L578 Other skin changes due to chronic exposure to nonionizing radiation: Secondary | ICD-10-CM | POA: Diagnosis not present

## 2023-05-24 DIAGNOSIS — D692 Other nonthrombocytopenic purpura: Secondary | ICD-10-CM

## 2023-05-24 DIAGNOSIS — D1801 Hemangioma of skin and subcutaneous tissue: Secondary | ICD-10-CM

## 2023-05-24 DIAGNOSIS — Z86018 Personal history of other benign neoplasm: Secondary | ICD-10-CM

## 2023-05-24 DIAGNOSIS — C4441 Basal cell carcinoma of skin of scalp and neck: Secondary | ICD-10-CM | POA: Diagnosis not present

## 2023-05-24 DIAGNOSIS — Z8589 Personal history of malignant neoplasm of other organs and systems: Secondary | ICD-10-CM

## 2023-05-24 DIAGNOSIS — D229 Melanocytic nevi, unspecified: Secondary | ICD-10-CM

## 2023-05-24 DIAGNOSIS — L821 Other seborrheic keratosis: Secondary | ICD-10-CM

## 2023-05-24 DIAGNOSIS — W908XXA Exposure to other nonionizing radiation, initial encounter: Secondary | ICD-10-CM

## 2023-05-24 DIAGNOSIS — L299 Pruritus, unspecified: Secondary | ICD-10-CM

## 2023-05-24 DIAGNOSIS — Z1283 Encounter for screening for malignant neoplasm of skin: Secondary | ICD-10-CM | POA: Diagnosis not present

## 2023-05-24 DIAGNOSIS — I872 Venous insufficiency (chronic) (peripheral): Secondary | ICD-10-CM

## 2023-05-24 DIAGNOSIS — D485 Neoplasm of uncertain behavior of skin: Secondary | ICD-10-CM

## 2023-05-24 DIAGNOSIS — L820 Inflamed seborrheic keratosis: Secondary | ICD-10-CM | POA: Diagnosis not present

## 2023-05-24 DIAGNOSIS — L814 Other melanin hyperpigmentation: Secondary | ICD-10-CM

## 2023-05-24 NOTE — Progress Notes (Signed)
Follow-Up Visit   Subjective  Richard Dean is a 72 y.o. male who presents for the following: Skin Cancer Screening and Upper Body Skin Exam  The patient presents for Upper Body Skin Exam (UBSE) for skin cancer screening and mole check. The patient has spots, moles and lesions to be evaluated, some may be new or changing and the patient may have concern these could be cancer.  The following portions of the chart were reviewed this encounter and updated as appropriate: medications, allergies, medical history  Review of Systems:  No other skin or systemic complaints except as noted in HPI or Assessment and Plan.  Objective  Well appearing patient in no apparent distress; mood and affect are within normal limits.  All skin waist up examined. Relevant physical exam findings are noted in the Assessment and Plan.  Face and scalp x 15, arms x 4 (15) Erythematous thin papules/macules with gritty scale.   ant mid line scalp 1.1 cm Hyperkeratotic papule.  Face and scalp x 15, arm x 1 (16) Erythematous stuck-on, waxy papule or plaque    Assessment & Plan   AK (actinic keratosis) (15) Face and scalp x 15, arms x 4  Actinic keratoses are precancerous spots that appear secondary to cumulative UV radiation exposure/sun exposure over time. They are chronic with expected duration over 1 year. A portion of actinic keratoses will progress to squamous cell carcinoma of the skin. It is not possible to reliably predict which spots will progress to skin cancer and so treatment is recommended to prevent development of skin cancer.  Recommend daily broad spectrum sunscreen SPF 30+ to sun-exposed areas, reapply every 2 hours as needed.  Recommend staying in the shade or wearing long sleeves, sun glasses (UVA+UVB protection) and wide brim hats (4-inch brim around the entire circumference of the hat). Call for new or changing lesions.   Destruction of lesion - Face and scalp x 15, arms x 4  (15) Complexity: simple   Destruction method: cryotherapy   Informed consent: discussed and consent obtained   Timeout:  patient name, date of birth, surgical site, and procedure verified Lesion destroyed using liquid nitrogen: Yes   Region frozen until ice ball extended beyond lesion: Yes   Outcome: patient tolerated procedure well with no complications   Post-procedure details: wound care instructions given    Neoplasm of uncertain behavior of skin ant mid line scalp  Epidermal / dermal shaving  Lesion diameter (cm):  1.1 Informed consent: discussed and consent obtained   Timeout: patient name, date of birth, surgical site, and procedure verified   Procedure prep:  Patient was prepped and draped in usual sterile fashion Prep type:  Isopropyl alcohol Anesthesia: the lesion was anesthetized in a standard fashion   Anesthetic:  1% lidocaine w/ epinephrine 1-100,000 buffered w/ 8.4% NaHCO3 Instrument used: flexible razor blade   Hemostasis achieved with: pressure, aluminum chloride and electrodesiccation   Outcome: patient tolerated procedure well   Post-procedure details: sterile dressing applied and wound care instructions given   Dressing type: bandage and petrolatum    Destruction of lesion Complexity: extensive   Destruction method: electrodesiccation and curettage   Informed consent: discussed and consent obtained   Timeout:  patient name, date of birth, surgical site, and procedure verified Procedure prep:  Patient was prepped and draped in usual sterile fashion Prep type:  Isopropyl alcohol Anesthesia: the lesion was anesthetized in a standard fashion   Anesthetic:  1% lidocaine w/ epinephrine 1-100,000 buffered w/  8.4% NaHCO3 Curettage performed in three different directions: Yes   Electrodesiccation performed over the curetted area: Yes   Final wound size (cm):  1.1 Hemostasis achieved with:  pressure, aluminum chloride and electrodesiccation Outcome: patient  tolerated procedure well with no complications   Post-procedure details: sterile dressing applied and wound care instructions given   Dressing type: bandage and petrolatum    Specimen 1 - Surgical pathology Differential Diagnosis: D48.5 r/o SCC  ED&C today Check Margins: No  Inflamed seborrheic keratosis (16) Face and scalp x 15, arm x 1  Symptomatic, irritating, patient would like treated.   Destruction of lesion - Face and scalp x 15, arm x 1 (16) Complexity: simple   Destruction method: cryotherapy   Informed consent: discussed and consent obtained   Timeout:  patient name, date of birth, surgical site, and procedure verified Lesion destroyed using liquid nitrogen: Yes   Region frozen until ice ball extended beyond lesion: Yes   Outcome: patient tolerated procedure well with no complications   Post-procedure details: wound care instructions given    Pruritus Arms and legs  Ok to continue Allegra daily as patient states that it resolves itching.   Skin cancer screening  Actinic skin damage  Lentigo  Melanocytic nevus, unspecified location  Purpura (HCC)  History of squamous cell carcinoma  History of dysplastic nevus  History of basal cell carcinoma  Stasis dermatitis of both legs   Skin cancer screening performed today.  Actinic Damage - Chronic condition, secondary to cumulative UV/sun exposure - diffuse scaly erythematous macules with underlying dyspigmentation - Recommend daily broad spectrum sunscreen SPF 30+ to sun-exposed areas, reapply every 2 hours as needed.  - Staying in the shade or wearing long sleeves, sun glasses (UVA+UVB protection) and wide brim hats (4-inch brim around the entire circumference of the hat) are also recommended for sun protection.  - Call for new or changing lesions.  Lentigines, Seborrheic Keratoses, Hemangiomas - Benign normal skin lesions - Benign-appearing - Call for any changes  Melanocytic Nevi - Tan-brown and/or  pink-flesh-colored symmetric macules and papules - Benign appearing on exam today - Observation - Call clinic for new or changing moles - Recommend daily use of broad spectrum spf 30+ sunscreen to sun-exposed areas.   HISTORY OF BASAL CELL CARCINOMA OF THE SKIN - No evidence of recurrence today - Recommend regular full body skin exams - Recommend daily broad spectrum sunscreen SPF 30+ to sun-exposed areas, reapply every 2 hours as needed.  - Call if any new or changing lesions are noted between office visits  HISTORY OF DYSPLASTIC NEVUS No evidence of recurrence today Recommend regular full body skin exams Recommend daily broad spectrum sunscreen SPF 30+ to sun-exposed areas, reapply every 2 hours as needed.  Call if any new or changing lesions are noted between office visits  HISTORY OF SQUAMOUS CELL CARCINOMA OF THE SKIN - No evidence of recurrence today - No lymphadenopathy - Recommend regular full body skin exams - Recommend daily broad spectrum sunscreen SPF 30+ to sun-exposed areas, reapply every 2 hours as needed.  - Call if any new or changing lesions are noted between office visits  Purpura - Chronic; persistent and recurrent.  Treatable, but not curable. - Violaceous macules and patches - Benign - Related to trauma, age, sun damage and/or use of blood thinners, chronic use of topical and/or oral steroids - Observe - Can use OTC arnica containing moisturizer such as Dermend Bruise Formula if desired - Call for worsening or other  concerns  STASIS DERMATITIS Exam: Erythematous, scaly patches involving the ankle and distal lower leg with associated lower leg edema.  Stasis in the legs causes chronic leg swelling, which may result in itchy or painful rashes, skin discoloration, skin texture changes, and sometimes ulceration.  Recommend daily graduated compression hose/stockings- easiest to put on first thing in morning, remove at bedtime.  Elevate legs as much as possible.  Avoid salt/sodium rich foods.  Return in about 6 months (around 11/21/2023) for sun exposed area recheck.  Maylene Roes, CMA, am acting as scribe for Armida Sans, MD .  Documentation: I have reviewed the above documentation for accuracy and completeness, and I agree with the above.  Armida Sans, MD

## 2023-05-24 NOTE — Patient Instructions (Addendum)

## 2023-05-26 ENCOUNTER — Encounter: Payer: Self-pay | Admitting: Dermatology

## 2023-05-26 LAB — SURGICAL PATHOLOGY

## 2023-05-29 ENCOUNTER — Telehealth: Payer: Self-pay

## 2023-05-29 NOTE — Telephone Encounter (Signed)
-----   Message from Armida Sans sent at 05/26/2023  7:21 PM EDT ----- FINAL DIAGNOSIS        1. Skin, ant mid line scalp :       BASAL CELL CARCINOMA, NODULAR AND INFILTRATIVE PATTERNS   Cancer = BCC Already treated Recheck next visit

## 2023-05-29 NOTE — Telephone Encounter (Signed)
Patient informed of pathology results 

## 2023-11-30 ENCOUNTER — Ambulatory Visit: Payer: Medicare HMO | Admitting: Dermatology

## 2023-11-30 DIAGNOSIS — C4491 Basal cell carcinoma of skin, unspecified: Secondary | ICD-10-CM

## 2023-11-30 HISTORY — DX: Basal cell carcinoma of skin, unspecified: C44.91

## 2023-12-04 ENCOUNTER — Encounter: Payer: Self-pay | Admitting: Dermatology

## 2023-12-04 ENCOUNTER — Ambulatory Visit: Admitting: Dermatology

## 2023-12-04 DIAGNOSIS — L820 Inflamed seborrheic keratosis: Secondary | ICD-10-CM

## 2023-12-04 DIAGNOSIS — L299 Pruritus, unspecified: Secondary | ICD-10-CM

## 2023-12-04 DIAGNOSIS — D492 Neoplasm of unspecified behavior of bone, soft tissue, and skin: Secondary | ICD-10-CM

## 2023-12-04 DIAGNOSIS — L578 Other skin changes due to chronic exposure to nonionizing radiation: Secondary | ICD-10-CM

## 2023-12-04 DIAGNOSIS — Z5111 Encounter for antineoplastic chemotherapy: Secondary | ICD-10-CM

## 2023-12-04 DIAGNOSIS — W908XXA Exposure to other nonionizing radiation, initial encounter: Secondary | ICD-10-CM

## 2023-12-04 DIAGNOSIS — Z7189 Other specified counseling: Secondary | ICD-10-CM

## 2023-12-04 DIAGNOSIS — C4441 Basal cell carcinoma of skin of scalp and neck: Secondary | ICD-10-CM

## 2023-12-04 DIAGNOSIS — L719 Rosacea, unspecified: Secondary | ICD-10-CM

## 2023-12-04 DIAGNOSIS — D229 Melanocytic nevi, unspecified: Secondary | ICD-10-CM

## 2023-12-04 DIAGNOSIS — Z79899 Other long term (current) drug therapy: Secondary | ICD-10-CM

## 2023-12-04 DIAGNOSIS — L814 Other melanin hyperpigmentation: Secondary | ICD-10-CM

## 2023-12-04 DIAGNOSIS — D692 Other nonthrombocytopenic purpura: Secondary | ICD-10-CM

## 2023-12-04 DIAGNOSIS — L821 Other seborrheic keratosis: Secondary | ICD-10-CM

## 2023-12-04 DIAGNOSIS — L570 Actinic keratosis: Secondary | ICD-10-CM

## 2023-12-04 DIAGNOSIS — D489 Neoplasm of uncertain behavior, unspecified: Secondary | ICD-10-CM

## 2023-12-04 DIAGNOSIS — D1801 Hemangioma of skin and subcutaneous tissue: Secondary | ICD-10-CM

## 2023-12-04 DIAGNOSIS — L711 Rhinophyma: Secondary | ICD-10-CM

## 2023-12-04 NOTE — Progress Notes (Signed)
 Follow-Up Visit   Subjective  Richard Dean is a 73 y.o. male who presents for the following: 6 month ak follow up, hx of aks at scalp, face, and arms, hx of bcc at anterior midline scalp treated with [facility] , reports there is a scab that wont go away in the area. Patient also has some other areas at scalp and chest he would like checked.  Would like to discuss rosacea and getting a refill of medication  The patient has spots, moles and lesions to be evaluated, some may be new or changing and the patient may have concern these could be cancer.  The following portions of the chart were reviewed this encounter and updated as appropriate: medications, allergies, medical history  Review of Systems:  No other skin or systemic complaints except as noted in HPI or Assessment and Plan.  Objective  Well appearing patient in no apparent distress; mood and affect are within normal limits.  A focused examination was performed of the following areas: Arms, face, scalp, chest   Relevant exam findings are noted in the Assessment and Plan.  hands, face, ears, scalp x 20 (20) Erythematous thin papules/macules with gritty scale.  left scalp x 2 (2) Erythematous stuck-on, waxy papule or plaque anterior midline scalp 1 cm crusted papule    Assessment & Plan   HEMANGIOMA Exam: red papule(s) Discussed benign nature. Recommend observation. Call for changes.  SEBORRHEIC KERATOSIS - Stuck-on, waxy, tan-brown papules and/or plaques  - Benign-appearing - Discussed benign etiology and prognosis. - Observe - Call for any changes  Purpura - Chronic; persistent and recurrent.  Treatable, but not curable. 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 Condition is symptomatic / bothersome to patient. Not to goal. Rosacea is a chronic progressive skin condition usually affecting the face of adults, causing redness and/or acne bumps. It is treatable but not curable. It sometimes affects the eyes (ocular rosacea) as well. It may respond to topical and/or systemic medication and can flare with stress, sun exposure, alcohol, exercise, topical steroids (including hydrocortisone/cortisone 10) and some foods.  Daily application of broad spectrum spf 30+ sunscreen to face is recommended to reduce flares.  Patient denies grittiness of the eyes  Treatment Plan Continue [facility] metronidazole/ivermectin/azelaic acid twice daily as needed to affected areas on the face. The patient was advised this is not covered by insurance since it is made by a compounding pharmacy. They will receive an email to check out and the medication will be mailed to their home.   ACTINIC KERATOSIS (20) hands, face, ears, scalp x 20 (20) Start treatment at scalp twice daily for 10 days then start cream in 1 month twice daily for 7 days to temples and forehead.  - Start 5-fluorouracil/calcipotriene cream twice a Lein for 10 days to affected areas including scalp, forehead and temples . Prescription sent to [facility]. Patient advised they will receive an email to purchase the medication online and have it sent to their home. Patient provided with handout reviewing treatment course and side effects and advised to call or  message [place] on MyChart with any concerns.  Reviewed course of treatment and expected reaction.  Patient advised to expect inflammation and crusting and advised that erosions are possible.  Patient advised to be diligent with sun protection during and after treatment. Counseled to keep  medication out of reach of children and pets.  ACTINIC DAMAGE WITH PRECANCEROUS ACTINIC KERATOSES Counseling for Topical Chemotherapy Management: Patient exhibits: - Severe, confluent actinic changes with pre-cancerous actinic keratoses that is secondary to cumulative UV radiation exposure over time - Condition that is severe; chronic, not at goal. - diffuse scaly erythematous macules and papules with underlying dyspigmentation - Discussed Prescription "Field Treatment" topical Chemotherapy for Severe, Chronic Confluent Actinic Changes with Pre-Cancerous Actinic Keratoses Field treatment involves treatment of an entire area of skin that has confluent Actinic Changes (Sun/ Ultraviolet light damage) and PreCancerous Actinic Keratoses by method of PhotoDynamic Therapy (PDT) and/or prescription Topical Chemotherapy agents such as 5-fluorouracil, 5-fluorouracil/calcipotriene, and/or imiquimod.  The purpose is to decrease the number of clinically evident and subclinical PreCancerous lesions to prevent progression to development of skin cancer by chemically destroying early precancer changes that may or may not be visible.  It has been shown to reduce the risk of developing skin cancer in the treated area. As a result of treatment, redness, scaling, crusting, and open sores may occur during treatment course. One or more than one of these methods may be used and may have to be used several times to control, suppress and eliminate the PreCancerous changes. Discussed treatment course, expected reaction, and possible side effects. - Recommend daily broad spectrum sunscreen SPF 30+ to sun-exposed areas, reapply every 2 hours as needed.  - Staying in the shade or wearing long sleeves, sun glasses (UVA+UVB protection) and wide brim hats (4-inch brim around the entire circumference of the hat) are also recommended. - Call for new or changing lesions. Actinic keratoses are precancerous spots that appear secondary to  cumulative UV radiation exposure/sun exposure over time. They are chronic with expected duration over 1 year. A portion of actinic keratoses will progress to squamous cell carcinoma of the skin. It is not possible to reliably predict which spots will progress to skin cancer and so treatment is recommended to prevent development of skin cancer.  Recommend daily broad spectrum sunscreen SPF 30+ to sun-exposed areas, reapply every 2 hours as needed.  Recommend staying in the shade or wearing long sleeves, sun glasses (UVA+UVB protection) and wide brim hats (4-inch brim around the entire circumference of the hat). Call for new or changing lesions. Destruction of lesion - hands, face, ears, scalp x 20 (20) Complexity: simple   Destruction method: cryotherapy   Informed consent: discussed and consent obtained   Timeout:  patient name, date of birth, surgical site, and procedure verified Lesion destroyed using liquid nitrogen: Yes   Region frozen until ice ball extended beyond lesion: Yes   Outcome: patient tolerated procedure well with no complications   Post-procedure details: wound care instructions given   Related Medications fluorouracil (EFUDEX) 5 % cream Apply topically See admin instructions. Apply for 7 days to temples INFLAMED SEBORRHEIC KERATOSIS (2) left scalp x 2 (2) Symptomatic, irritating, patient would like treated. Destruction of lesion - left scalp x 2 (2) Complexity: simple   Destruction method: cryotherapy   Informed consent: discussed and consent obtained   Timeout:  patient name, date of birth, surgical site, and procedure verified Lesion destroyed using liquid nitrogen: Yes   Region frozen until ice ball extended beyond  lesion: Yes   Outcome: patient tolerated procedure well with no complications   Post-procedure details: wound care instructions given   NEOPLASM OF UNCERTAIN BEHAVIOR anterior midline scalp Skin / nail biopsy Type of biopsy: tangential   Informed  consent: discussed and consent obtained   Patient was prepped and draped in usual sterile fashion: Area prepped with alcohol. Anesthesia: the lesion was anesthetized in a standard fashion   Anesthetic:  1% lidocaine w/ epinephrine 1-100,000 buffered w/ 8.4% NaHCO3 Instrument used: flexible razor blade   Hemostasis achieved with: pressure, aluminum chloride and electrodesiccation   Outcome: patient tolerated procedure well   Post-procedure details: wound care instructions given   Post-procedure details comment:  Ointment and small bandage applied Specimen 1 - Surgical pathology Differential Diagnosis: r/o recurrent bcc   Check Margins: No  Refer to previous pathology  Collected: 05/24/2023  Accession: [id number] R/o recurrent bcc   Refer to previous pathology  Collected: 05/24/2023  Accession: [id number]  Return in about 6 months (around 06/05/2024) for ak followup.  IAsher Muir, CMA, am acting as scribe for Armida Sans, MD.   Documentation: I have reviewed the above documentation for accuracy and completeness, and I agree with the above.  Armida Sans, MD

## 2023-12-04 NOTE — Patient Instructions (Addendum)
 Start right away twice a Giannelli for 10 days to scalp and then wait a month and apply cream twice daily for 7 days to temples and forehead  - Start 5-fluorouracil/calcipotriene cream twice a Rembold for 10 days to affected areas including scalp forehead and temples. Prescription sent to [facility]. Patient advised they will receive an email to purchase the medication online and have it sent to their home. Patient provided with handout reviewing treatment course and side effects and advised to call or message [place] on MyChart with any concerns.  Reviewed course of treatment and expected reaction.  Patient advised to expect inflammation and crusting and advised that erosions are possible.  Patient advised to be diligent with sun protection during and after treatment. Counseled to keep medication out of reach of children and pets.     Instructions for [facility] Medications  One or more of your medications was sent to the [facility] mail order compounding pharmacy. You will receive an email from them and can purchase the medicine through that link. It will then be mailed to your home at the address you confirmed. If for any reason you do not receive an email from them, please check your spam folder. If you still do not find the email, please let us know. [facility] phone number is [phone].    5-Fluorouracil/Calcipotriene Patient Education   Actinic keratoses are the dry, red scaly spots on the skin caused by sun damage. A portion of these spots can turn into skin cancer with time, and treating them can help prevent development of skin cancer.   Treatment of these spots requires removal of the defective skin cells. There are various ways to remove actinic keratoses, including freezing with liquid nitrogen, treatment with creams, or treatment with a blue light procedure in the office.   5-fluorouracil cream is a topical cream used to treat actinic keratoses. It works  by interfering with the growth of abnormal fast-growing skin cells, such as actinic keratoses. These cells peel off and are replaced by healthy ones.   5-fluorouracil/calcipotriene is a combination of the 5-fluorouracil cream with a vitamin D analog cream called calcipotriene. The calcipotriene alone does not treat actinic keratoses. However, when it is combined with 5-fluorouracil, it helps the 5-fluorouracil treat the actinic keratoses much faster so that the same results can be achieved with a much shorter treatment time.  INSTRUCTIONS FOR 5-FLUOROURACIL/CALCIPOTRIENE CREAM:   5-fluorouracil/calcipotriene cream typically only needs to be used for 4-7 days. A thin layer should be applied twice a Tadlock to the treatment areas recommended by your physician.   If your physician prescribed you separate tubes of 5-fluourouracil and calcipotriene, apply a thin layer of 5-fluorouracil followed by a thin layer of calcipotriene.   Avoid contact with your eyes, nostrils, and mouth. Do not use 5-fluorouracil/calcipotriene cream on infected or open wounds.   You will develop redness, irritation and some crusting at areas where you have pre-cancer damage/actinic keratoses. IF YOU DEVELOP PAIN, BLEEDING, OR SIGNIFICANT CRUSTING, STOP THE TREATMENT EARLY - you have already gotten a good response and the actinic keratoses should clear up well.  Wash your hands after applying 5-fluorouracil 5% cream on your skin.   A moisturizer or sunscreen with a minimum SPF 30 should be applied each morning.   Once you have finished the treatment, you can apply a thin layer of Vaseline twice a Fike to irritated areas to soothe and calm the areas more quickly. If you experience significant  discomfort, contact your physician.  For some patients it is necessary to repeat the treatment for best results.  SIDE EFFECTS: When using 5-fluorouracil/calcipotriene cream, you may have mild irritation, such as redness, dryness, swelling,  or a mild burning sensation. This usually resolves within 2 weeks. The more actinic keratoses you have, the more redness and inflammation you can expect during treatment. Eye irritation has been reported rarely. If this occurs, please let us know.  If you have any trouble using this cream, please call the office. If you have any other questions about this information, please do not hesitate to ask me before you leave the office.  Rosacea cream  Will prescribe [facility] metronidazole/ivermectin/azelaic acid twice daily as needed to affected areas on the face. The patient was advised this is not covered by insurance since it is made by a compounding pharmacy. They will receive an email to check out and the medication will be mailed to their home.    Instructions for [facility] Medications  One or more of your medications was sent to the [facility] mail order compounding pharmacy. You will receive an email from them and can purchase the medicine through that link. It will then be mailed to your home at the address you confirmed. If for any reason you do not receive an email from them, please check your spam folder. If you still do not find the email, please let us know. [facility] phone number is [phone].      Actinic keratoses are precancerous spots that appear secondary to cumulative UV radiation exposure/sun exposure over time. They are chronic with expected duration over 1 year. A portion of actinic keratoses will progress to squamous cell carcinoma of the skin. It is not possible to reliably predict which spots will progress to skin cancer and so treatment is recommended to prevent development of skin cancer.  Recommend daily broad spectrum sunscreen SPF 30+ to sun-exposed areas, reapply every 2 hours as needed.  Recommend staying in the shade or wearing long sleeves, sun glasses (UVA+UVB protection) and wide brim hats (4-inch brim around the entire circumference of the  hat). Call for new or changing lesions.   Cryotherapy Aftercare  Wash gently with soap and water everyday.   Apply Vaseline and Band-Aid daily until healed.        Seborrheic Keratosis  What causes seborrheic keratoses? Seborrheic keratoses are harmless, common skin growths that first appear during adult life.  As time goes by, more growths appear.  Some people may develop a large number of them.  Seborrheic keratoses appear on both covered and uncovered body parts.  They are not caused by sunlight.  The tendency to develop seborrheic keratoses can be inherited.  They vary in color from skin-colored to gray, brown, or even black.  They can be either smooth or have a rough, warty surface.   Seborrheic keratoses are superficial and look as if they were stuck on the skin.  Under the microscope this type of keratosis looks like layers upon layers of skin.  That is why at times the top layer may seem to fall off, but the rest of the growth remains and re-grows.    Treatment Seborrheic keratoses do not need to be treated, but can easily be removed in the office.  Seborrheic keratoses often cause symptoms when they rub on clothing or jewelry.  Lesions can be in the way of shaving.  If they become inflamed, they can cause itching, soreness, or burning.  Removal of a seborrheic keratosis can be accomplished by freezing, burning, or surgery. If any spot bleeds, scabs, or grows rapidly, please return to have it checked, as these can be an indication of a skin cancer.    Due to recent changes in healthcare laws, you may see results of your pathology and/or laboratory studies on MyChart before the doctors have had a chance to review them. We understand that in some cases there may be results that are confusing or concerning to you. Please understand that not all results are received at the same time and often the doctors may need to interpret multiple results in order to provide you with the best plan of  care or course of treatment. Therefore, we ask that you please give [place] 2 business days to thoroughly review all your results before contacting the office for clarification. Should we see a critical lab result, you will be contacted sooner.   If You Need Anything After Your Visit  If you have any questions or concerns for your doctor, please call our main line at [phone] and press option 4 to reach your doctor's medical assistant. If no one answers, please leave a voicemail as directed and we will return your call as soon as possible. Messages left after 4 pm will be answered the following business Jindra.   You may also send [place] a message via MyChart. We typically respond to MyChart messages within 1-2 business days.  For prescription refills, please ask your pharmacy to contact our office. Our fax number is [phone].  If you have an urgent issue when the clinic is closed that cannot wait until the next business [place], you can page your doctor at the number below.    Please note that while we do our best to be available for urgent issues outside of office hours, we are not available 24/7.   If you have an urgent issue and are unable to reach [place], you may choose to seek medical care at your doctor's office, retail clinic, urgent care center, or emergency room.  If you have a medical emergency, please immediately call 911 or go to the emergency department.  Pager Numbers  - Dr. Gwen Pounds: [phone]  - Dr. Roseanne Reno: [phone]  - Dr. Katrinka Blazing: [phone]   In the event of inclement weather, please call our main line at [phone] for an update on the status of any delays or closures.  Dermatology Medication Tips: Please keep the boxes that topical medications come in in order to help keep track of the instructions about where and how to use these. Pharmacies typically print the medication instructions only on the boxes and not directly on the medication tubes.   If your medication  is too expensive, please contact our office at [phone] option 4 or send [place] a message through MyChart.   We are unable to tell what your co-pay for medications will be in advance as this is different depending on your insurance coverage. However, we may be able to find a substitute medication at lower cost or fill out paperwork to get insurance to cover a needed medication.   If a prior authorization is required to get your medication covered by your insurance company, please allow [place] 1-2 business days to complete this process.  Drug prices often vary depending on where the prescription is filled and some pharmacies may offer cheaper prices.  The website www.goodrx.com contains coupons for medications through different pharmacies. The prices here do not account for what  the cost may be with help from insurance (it may be cheaper with your insurance), but the website can give you the price if you did not use any insurance.  - You can print the associated coupon and take it with your prescription to the pharmacy.  - You may also stop by our office during regular business hours and pick up a GoodRx coupon card.  - If you need your prescription sent electronically to a different pharmacy, notify our office through [facility] or by phone at [phone] option 4.     Si Usted Necesita Algo Despus de Su Visita  Tambin puede enviarnos un mensaje a travs de Clinical cytogeneticist. Por lo general respondemos a los mensajes de MyChart en el transcurso de 1 a 2 das hbiles.  Para renovar recetas, por favor pida a su farmacia que se ponga en contacto con nuestra oficina. Annie Sable de fax es [place] [phone].  Si tiene un asunto urgente cuando la clnica est cerrada y que no puede esperar hasta el siguiente da hbil, puede llamar/localizar a su doctor(a) al nmero que aparece a continuacin.   Por favor, tenga en cuenta que aunque hacemos todo lo posible para estar disponibles para asuntos  urgentes fuera del horario de [place], no estamos disponibles las 24 horas del da, los 7 [address]  [address] de la [place].   Si tiene un problema urgente y no puede comunicarse con nosotros, puede optar por buscar atencin mdica  en el consultorio de su doctor(a), en una clnica privada, en un centro de atencin urgente o en una sala de emergencias.  Si tiene Engineer, drilling, por favor llame inmediatamente al 911 o vaya a la sala de emergencias.  Nmeros de bper  - Dr. Gwen Pounds: [phone]  - Dra. Roseanne Reno: [phone]  - Dr. Katrinka Blazing: [phone]   En caso de inclemencias del tiempo, por favor llame a Lacy Duverney principal al [phone] para una actualizacin sobre el [place] de cualquier retraso o cierre.  Consejos para la medicacin en dermatologa: Por favor, guarde las cajas en las que vienen los medicamentos de uso tpico para ayudarle a seguir las instrucciones sobre dnde y cmo usarlos. Las farmacias generalmente imprimen las instrucciones del medicamento slo en las cajas y no directamente en los tubos del [place].   Si su medicamento es muy caro, por favor, pngase en contacto con Rolm Gala llamando al [phone] y presione la opcin 4 o envenos un mensaje a travs de Clinical cytogeneticist.   No podemos decirle cul ser su copago por los medicamentos por adelantado ya que esto es diferente dependiendo de la cobertura de su seguro. Sin embargo, es posible que podamos encontrar un medicamento sustituto a Audiological scientist un formulario para que el seguro cubra el medicamento que se considera necesario.   Si se requiere una autorizacin previa para que su compaa de seguros [place] su medicamento, por favor permtanos de 1 a 2 das hbiles para completar [address]  Los precios de los medicamentos varan con frecuencia dependiendo del Environmental consultant de dnde se surte la receta y alguna farmacias pueden ofrecer precios ms baratos.  El sitio web www.goodrx.com tiene cupones para  medicamentos de Health and safety inspector. Los precios aqu no tienen en cuenta lo que podra costar con la ayuda del seguro (puede ser ms barato con su seguro), pero el sitio web puede darle el precio si no utiliz Tourist information centre manager.  - Puede imprimir el cupn correspondiente y llevarlo con su receta a la farmacia.  - Tambin puede  pasar por nuestra oficina durante el horario de atencin regular y Education officer, museum una tarjeta de cupones de GoodRx.  - Si necesita que su receta se enve electrnicamente a una farmacia diferente, informe a nuestra oficina a travs de MyChart de [place] o por telfono llamando al [phone] y presione la opcin 4.

## 2023-12-05 ENCOUNTER — Encounter: Payer: Self-pay | Admitting: Dermatology

## 2023-12-05 LAB — SURGICAL PATHOLOGY

## 2023-12-06 ENCOUNTER — Telehealth: Payer: Self-pay

## 2023-12-06 DIAGNOSIS — C4441 Basal cell carcinoma of skin of scalp and neck: Secondary | ICD-10-CM

## 2023-12-06 NOTE — Telephone Encounter (Addendum)
 Patient returned call. Discussed results and referral for Mohs. Orders placed for referral with Dr. Caralyn Guile. ---- Message from Armida Sans sent at 12/05/2023  8:35 PM EDT ----- FINAL DIAGNOSIS        1. Skin, anterior midline scalp :       BASAL CELL CARCINOMA, NODULAR AND INFILTRATIVE PATTERNS   Cancer = BCC Recurrent and infiltrative Schedule for Mohs with Dr. Caralyn Guile

## 2023-12-06 NOTE — Telephone Encounter (Deleted)
-----   Message from Armida Sans sent at 12/05/2023  8:35 PM EDT ----- FINAL DIAGNOSIS        1. Skin, anterior midline scalp :       BASAL CELL CARCINOMA, NODULAR AND INFILTRATIVE PATTERNS   Cancer = BCC Recurrent and infiltrative Schedule for Mohs with Dr. Caralyn Guile

## 2023-12-06 NOTE — Telephone Encounter (Addendum)
 Tried calling patient regarding results. No answer. LM for patient to return call. Order placed for Mohs referral with Dr. Caralyn Guile.   ----- Message from Armida Sans sent at 12/05/2023  8:35 PM EDT ----- FINAL DIAGNOSIS        1. Skin, anterior midline scalp :       BASAL CELL CARCINOMA, NODULAR AND INFILTRATIVE PATTERNS   Cancer = BCC Recurrent and infiltrative Schedule for Mohs with Dr. Caralyn Guile

## 2023-12-07 ENCOUNTER — Telehealth: Payer: Self-pay

## 2023-12-07 NOTE — Telephone Encounter (Signed)
-----   Message from Armida Sans sent at 12/05/2023  8:35 PM EDT ----- FINAL DIAGNOSIS        1. Skin, anterior midline scalp :       BASAL CELL CARCINOMA, NODULAR AND INFILTRATIVE PATTERNS   Cancer = BCC Recurrent and infiltrative Schedule for Mohs with Dr. Caralyn Guile

## 2023-12-07 NOTE — Telephone Encounter (Signed)
 Discussed results with patient, referral made by Asher Muir, CMA yesterday and their office has him scheduled for 12/27/23 at 9:30 am.

## 2023-12-21 ENCOUNTER — Encounter: Payer: Self-pay | Admitting: Dermatology

## 2023-12-27 ENCOUNTER — Encounter: Payer: Self-pay | Admitting: Dermatology

## 2023-12-27 ENCOUNTER — Ambulatory Visit: Admitting: Dermatology

## 2023-12-27 VITALS — BP 136/73 | HR 60 | Temp 98.7°F

## 2023-12-27 DIAGNOSIS — L814 Other melanin hyperpigmentation: Secondary | ICD-10-CM

## 2023-12-27 DIAGNOSIS — L579 Skin changes due to chronic exposure to nonionizing radiation, unspecified: Secondary | ICD-10-CM | POA: Diagnosis not present

## 2023-12-27 DIAGNOSIS — C4491 Basal cell carcinoma of skin, unspecified: Secondary | ICD-10-CM

## 2023-12-27 DIAGNOSIS — C4441 Basal cell carcinoma of skin of scalp and neck: Secondary | ICD-10-CM | POA: Diagnosis not present

## 2023-12-27 MED ORDER — DOXYCYCLINE HYCLATE 100 MG PO TABS
100.0000 mg | ORAL_TABLET | Freq: Two times a day (BID) | ORAL | 0 refills | Status: AC
Start: 2023-12-27 — End: 2024-01-06

## 2023-12-27 MED ORDER — TRAMADOL HCL 50 MG PO TABS
50.0000 mg | ORAL_TABLET | Freq: Four times a day (QID) | ORAL | 0 refills | Status: AC | PRN
Start: 2023-12-27 — End: ?

## 2023-12-27 NOTE — Patient Instructions (Signed)

## 2023-12-27 NOTE — Progress Notes (Signed)
 Follow-Up Visit   Subjective  Richard Dean is a 73 y.o. male who presents for the following: Mohs of a Nodular and Infiltrative Basal Cell Carcinoma on the anterior midline scalp, referred by Dr. Gwen Pounds. Patient is accompanied by his son.  The following portions of the chart were reviewed this encounter and updated as appropriate: medications, allergies, medical history  Review of Systems:  No other skin or systemic complaints except as noted in HPI or Assessment and Plan.  Objective  Well appearing patient in no apparent distress; mood and affect are within normal limits.  A focused examination was performed of the following areas: Anterior midline scalp Relevant physical exam findings are noted in the Assessment and Plan.     Assessment & Plan   BASAL CELL CARCINOMA (BCC), UNSPECIFIED SITE anterior midline scalp Mohs surgery  Consent obtained: written  Anticoagulation: Was the anticoagulation regimen changed prior to Mohs? No    Procedure Details: Timeout: pre-procedure verification complete Procedure Prep: patient was prepped and draped in usual sterile fashion Pre-Op diagnosis: basal cell carcinoma BCC subtype: infiltrative and nodular MohsAIQ Surgical site (if tumor spans multiple areas, please select predominant area): scalp Surgical site (from skin exam): anterior midline scalp Pre-operative length (cm): 1.8 Pre-operative width (cm): 1.7 Indications for Mohs surgery: anatomic location where tissue conservation is critical  Micrographic Surgery Details: Post-operative length (cm): 2.2 Post-operative width (cm): 2.5 Number of Mohs stages: 2  Skin repair Complexity:  Complex Final length (cm):  6.2 Informed consent: discussed and consent obtained   Timeout: patient name, date of birth, surgical site, and procedure verified   Procedure prep:  Patient was prepped and draped in usual sterile fashion Prep type:  Chlorhexidine Anesthesia: the lesion was  anesthetized in a standard fashion   Anesthetic:  1% lidocaine w/ epinephrine 1-100,000 buffered w/ 8.4% NaHCO3 Reason for type of repair: reduce tension to allow closure, reduce the risk of dehiscence, infection, and necrosis, preserve normal anatomy, avoid adjacent structures and allow side-to-side closure without requiring a flap or graft   Undermining: area extensively undermined   Subcutaneous layers (deep stitches):  Suture size:  3-0 Suture type: Vicryl (polyglactin 910)   Stitches:  Buried vertical mattress Fine/surface layer approximation (top stitches):  Fine approximation suture size: with staples on top. Suture type comment:  Staples Hemostasis achieved with: suture, pressure and electrodesiccation Outcome: patient tolerated procedure well with no complications   Post-procedure details: sterile dressing applied and wound care instructions given   Dressing type: bandage and pressure dressing   Related Medications traMADol (ULTRAM) 50 MG tablet Take 1 tablet (50 mg total) by mouth every 6 (six) hours as needed for up to 8 doses. doxycycline (VIBRA-TABS) 100 MG tablet Take 1 tablet (100 mg total) by mouth 2 (two) times daily for 10 days.   Return in about 10 days (around 01/06/2024) for wound check.  Owens Shark, CMA, am acting as scribe for Gwenith Daily, MD.    12/27/2023  HISTORY OF PRESENT ILLNESS  Richard Dean is seen in consultation at the request of Dr. Gwen Pounds for biopsy-proven Nodular and Infiltrative Basal Cell Carcinoma on the anterior scalp. They note that the area has been present for about 1 year increasing in size with time.  There is no history of previous treatment.  Reports no other new or changing lesions and has no other complaints today.  Medications and allergies: see patient chart.  Review of systems: Reviewed 8 systems and notable for the  above skin cancer.  All other systems reviewed are unremarkable/negative, unless noted in the HPI. Past  medical history, surgical history, family history, social history were also reviewed and are noted in the chart/questionnaire.    PHYSICAL EXAMINATION  General: Well-appearing, in no acute distress, alert and oriented x 4. Vitals reviewed in chart (if available).   Skin: Exam reveals a 1.8 x 1.7 cm erythematous papule and biopsy scar on the anterior scalp. There are rhytids, telangiectasias, and lentigines, consistent with photodamage.   Biopsy report(s) reviewed, confirming the diagnosis.   ASSESSMENT  1) Nodular and Infiltrative Basal Cell Carcinoma on the anterior scalp 2) photodamage 3) solar lentigines   PLAN   1. Due to location, size, histology, or recurrence and the likelihood of subclinical extension as well as the need to conserve normal surrounding tissue, the patient was deemed acceptable for Mohs micrographic surgery (MMS).  The nature and purpose of the procedure, associated benefits and risks including recurrence and scarring, possible complications such as pain, infection, and bleeding, and alternative methods of treatment if appropriate were discussed with the patient during consent. The lesion location was verified by the patient, by reviewing previous notes, pathology reports, and by photographs as well as angulation measurements if available.  Informed consent was reviewed and signed by the patient, and timeout was performed at 9:30 AM. See op note below.  2. For the photodamage and solar lentigines, sun protection discussed/information given on OTC sunscreens, and we recommend continued regular follow-up with primary dermatologist every 6 months or sooner for any growing, bleeding, or changing lesions. 3. Prognosis and future surveillance discussed. 4. Letter with treatment outcome sent to referring provider. 5. Pain acetaminophen/ibuprofen/tramadol 50 mg  MOHS MICROGRAPHIC SURGERY AND RECONSTRUCTION  Initial size:   1.8 x 1.7 cm Surgical defect/wound size: 2.2 x 2.5  cm Anesthesia:    0.33% lidocaine with 1:200,000 epinephrine EBL:    <5 mL Complications:  None Repair type:   Complex SQ suture:   3-0 Vicryl Cutaneous suture:  Staples Final size of the repair: 6.2 cm  Stages: 2  STAGE I: Anesthesia achieved with 0.5% lidocaine with 1:200,000 epinephrine. ChloraPrep applied. 2 section(s) excised using Mohs technique (this includes total peripheral and deep tissue margin excision and evaluation with frozen sections, excised and interpreted by the same physician). The tumor was first debulked and then excised with an approx. 2 mm margin.  Hemostasis was achieved with electrocautery as needed.  The specimen was then oriented, subdivided/relaxed, inked, and processed using Mohs technique.    Frozen section analysis revealed a positive margin for thin cords or strands of basaloid tumor cells that deeply infiltrate the surrounding stroma, often with irregular, angulated edges, appearing as a permeating invasion pattern at the tumor margins; these strands are embedded within a dense, collagenous stroma, with less prominent peripheral palisading and retraction in the deep margin.    STAGE II: An additional 2 mm margin was excised.  Hemostasis was achieved with electrocautery as needed.  The specimen was then oriented, subdivided/relaxed, inked, and processed using Mohs technique. Evaluation of slides by the Mohs surgeon revealed clear tumor margins.  Reconstruction  The surgical wound was then cleaned, prepped, and re-anesthetized as above. Wound edges were undermined extensively along at least one entire edge and at a distance equal to or greater than the width of the defect (see wound defect size above) in order to achieve closure and decrease wound tension and anatomic distortion. Redundant tissue repair including standing cone removal was  performed. Hemostasis was achieved with electrocautery. Subcutaneous and epidermal tissues were approximated with the above  sutures. The surgical site was then lightly scrubbed with sterile, saline-soaked gauze. The area was then bandaged using Vaseline ointment, non-adherent gauze, gauze pads, and tape to provide an adequate pressure dressing. The patient tolerated the procedure well, was given detailed written and verbal wound care instructions, and was discharged in good condition.   The patient will follow-up: 10 days.  - traMADol (ULTRAM) 50 MG tablet; Take 1 tablet (50 mg total) by mouth every 6 (six) hours as needed for up to 8 doses.  Dispense: 8 tablet; Refill: 0 - doxycycline (VIBRA-TABS) 100 MG tablet; Take 1 tablet (100 mg total) by mouth 2 (two) times daily for 10 days.  Dispense: 20 tablet; Refill: 0   Documentation: I have reviewed the above documentation for accuracy and completeness, and I agree with the above.  Deneise Finlay, MD

## 2024-01-04 ENCOUNTER — Ambulatory Visit: Admitting: Dermatology

## 2024-01-04 ENCOUNTER — Encounter: Payer: Self-pay | Admitting: Dermatology

## 2024-01-04 VITALS — BP 138/70 | HR 82

## 2024-01-04 DIAGNOSIS — L905 Scar conditions and fibrosis of skin: Secondary | ICD-10-CM

## 2024-01-04 DIAGNOSIS — T1490XD Injury, unspecified, subsequent encounter: Secondary | ICD-10-CM

## 2024-01-04 DIAGNOSIS — Z85828 Personal history of other malignant neoplasm of skin: Secondary | ICD-10-CM

## 2024-01-04 DIAGNOSIS — Z48817 Encounter for surgical aftercare following surgery on the skin and subcutaneous tissue: Secondary | ICD-10-CM

## 2024-01-04 DIAGNOSIS — C4491 Basal cell carcinoma of skin, unspecified: Secondary | ICD-10-CM

## 2024-01-04 NOTE — Progress Notes (Signed)
   Follow Up Visit   Subjective  Richard Dean is a 73 y.o. male who presents for the following: follow up from Mohs surgery and staple removal  The patient presents for follow up from Mohs surgery for a BCC on the anterior midline scalp, treated on 12/27/23, repaired with linear repair using staples. The patient has been bandaging the wound as directed. The endorse the following concerns: No questions or concerns at this time.   The following portions of the chart were reviewed this encounter and updated as appropriate: medications, allergies, medical history  Review of Systems:  No other skin or systemic complaints except as noted in HPI or Assessment and Plan.  Objective  Well appearing patient in no apparent distress; mood and affect are within normal limits.  A full examination was performed including scalp, head, face and scalp. All findings within normal limits unless otherwise noted below.  Healing wound with mild erythema  Relevant physical exam findings are noted in the Assessment and Plan.     Assessment & Plan   Healing s/p Mohs for [facility], treated on 12/27/23, repaired with linear closure using staples - Reassured that wound is healing well - Staples removed today - No evidence of infection - No swelling, induration, purulence, dehiscence, or tenderness out of proportion to the clinical exam, see photo above - Discussed that scars take up to 12 months to mature from the date of surgery - Recommend SPF 30+ to scar daily to prevent purple color from UV exposure during scar maturation process - Discussed that erythema and raised appearance of scar will fade over the next 4-6 months - OK to start scar massage at 4-6 weeks post-op - Can consider silicone based products for scar healing starting at 6 weeks post-op - Ok to continue ointment daily to wound under a bandage for another week  HISTORY OF BASAL CELL CARCINOMA OF THE SKIN - No evidence of recurrence today - Recommend  regular full body skin exams - Recommend daily broad spectrum sunscreen SPF 30+ to sun-exposed areas, reapply every 2 hours as needed.  - Call if any new or changing lesions are noted between office visits  Return in about 3 weeks (around 01/25/2024).   Documentation: I have reviewed the above documentation for accuracy and completeness, and I agree with the above.  Deneise Finlay, MD

## 2024-01-18 ENCOUNTER — Encounter: Payer: Self-pay | Admitting: Dermatology

## 2024-01-18 ENCOUNTER — Ambulatory Visit: Admitting: Dermatology

## 2024-01-18 VITALS — BP 105/68 | HR 96

## 2024-01-18 DIAGNOSIS — L539 Erythematous condition, unspecified: Secondary | ICD-10-CM | POA: Diagnosis not present

## 2024-01-18 DIAGNOSIS — Z85828 Personal history of other malignant neoplasm of skin: Secondary | ICD-10-CM

## 2024-01-18 DIAGNOSIS — T1490XD Injury, unspecified, subsequent encounter: Secondary | ICD-10-CM

## 2024-01-18 DIAGNOSIS — L905 Scar conditions and fibrosis of skin: Secondary | ICD-10-CM

## 2024-01-18 DIAGNOSIS — C4491 Basal cell carcinoma of skin, unspecified: Secondary | ICD-10-CM

## 2024-01-18 NOTE — Progress Notes (Signed)
   Follow Up Visit   Subjective  Richard Dean is a 73 y.o. male who presents for the following: follow up from Mohs surgery   The patient presents for follow up from Mohs surgery for a BCC on the anterior midline scalp, treated on 12/27/23, repaired with staples. The patient has been bandaging the wound as directed. The endorse the following concerns: No questions or concerns at this time  The following portions of the chart were reviewed this encounter and updated as appropriate: medications, allergies, medical history  Review of Systems:  No other skin or systemic complaints except as noted in HPI or Assessment and Plan.  Objective  Well appearing patient in no apparent distress; mood and affect are within normal limits.  A full examination was performed including scalp, head, face and scalp. All findings within normal limits unless otherwise noted below.  Healing wound with mild erythema  Relevant physical exam findings are noted in the Assessment and Plan.    Assessment & Plan   Healing s/p Mohs for [facility], treated on 12/27/2023 on the anterior midline scalp, repaired with staples - Reassured that wound is healing well - No evidence of infection - No swelling, induration, purulence, dehiscence, or tenderness out of proportion to the clinical exam, see photo above - Discussed that scars take up to 12 months to mature from the date of surgery - Recommend SPF 30+ to scar daily to prevent purple color from UV exposure during scar maturation process - Discussed that erythema and raised appearance of scar will fade over the next 4-6 months - OK to start scar massage at 4-6 weeks post-op - Can consider silicone based products for scar healing starting at 6 weeks post-op - Ok to discontinue ointment daily to wound.  HISTORY OF BASAL CELL CARCINOMA OF THE SKIN - No evidence of recurrence today - Recommend regular full body skin exams - Recommend daily broad spectrum sunscreen SPF 30+ to  sun-exposed areas, reapply every 2 hours as needed.  - Call if any new or changing lesions are noted between office visits   Return if symptoms worsen or fail to improve.  I, Haig Levan, Surg Tech III, am acting as scribe for Deneise Finlay, MD.   Documentation: I have reviewed the above documentation for accuracy and completeness, and I agree with the above.  Deneise Finlay, MD

## 2024-01-18 NOTE — Patient Instructions (Signed)

## 2024-02-14 ENCOUNTER — Encounter: Payer: Self-pay | Admitting: Dermatology

## 2024-06-04 ENCOUNTER — Ambulatory Visit: Admitting: Dermatology

## 2024-06-04 ENCOUNTER — Encounter: Payer: Self-pay | Admitting: Dermatology

## 2024-06-04 DIAGNOSIS — D692 Other nonthrombocytopenic purpura: Secondary | ICD-10-CM

## 2024-06-04 DIAGNOSIS — L711 Rhinophyma: Secondary | ICD-10-CM

## 2024-06-04 DIAGNOSIS — Z85828 Personal history of other malignant neoplasm of skin: Secondary | ICD-10-CM

## 2024-06-04 DIAGNOSIS — L719 Rosacea, unspecified: Secondary | ICD-10-CM | POA: Diagnosis not present

## 2024-06-04 DIAGNOSIS — Z8589 Personal history of malignant neoplasm of other organs and systems: Secondary | ICD-10-CM

## 2024-06-04 DIAGNOSIS — L570 Actinic keratosis: Secondary | ICD-10-CM | POA: Diagnosis not present

## 2024-06-04 DIAGNOSIS — W908XXA Exposure to other nonionizing radiation, initial encounter: Secondary | ICD-10-CM | POA: Diagnosis not present

## 2024-06-04 DIAGNOSIS — L820 Inflamed seborrheic keratosis: Secondary | ICD-10-CM | POA: Diagnosis not present

## 2024-06-04 DIAGNOSIS — L821 Other seborrheic keratosis: Secondary | ICD-10-CM

## 2024-06-04 DIAGNOSIS — L578 Other skin changes due to chronic exposure to nonionizing radiation: Secondary | ICD-10-CM

## 2024-06-04 DIAGNOSIS — Z79899 Other long term (current) drug therapy: Secondary | ICD-10-CM

## 2024-06-04 DIAGNOSIS — Z7189 Other specified counseling: Secondary | ICD-10-CM

## 2024-06-04 NOTE — Progress Notes (Signed)
 Follow-Up Visit   Subjective  Richard Dean is a 73 y.o. male who presents for the following: 6 months f/u on precancers, patient c/o a growth on his right arm that will not go away.  Hx of skin cancers  The patient has spots, moles and lesions to be evaluated, some may be new or changing and the patient may have concern these could be cancer.  The following portions of the chart were reviewed this encounter and updated as appropriate: medications, allergies, medical history  Review of Systems:  No other skin or systemic complaints except as noted in HPI or Assessment and Plan.  Objective  Well appearing patient in no apparent distress; mood and affect are within normal limits.  A focused examination was performed of the following areas: Face,scalp,arms   Relevant exam findings are noted in the Assessment and Plan.  face x 17 (16) Erythematous thin papules/macules with gritty scale.  arms x 6 Stuck-on, waxy, tan-brown papules and plaques -- Discussed benign etiology and prognosis.   Assessment & Plan   HISTORY OF BASAL CELL CARCINOMA OF THE SKIN - No evidence of recurrence today - Recommend regular full body skin exams - Recommend daily broad spectrum sunscreen SPF 30+ to sun-exposed areas, reapply every 2 hours as needed.  - Call if any new or changing lesions are noted between office visits   HISTORY OF SQUAMOUS CELL CARCINOMA OF THE SKIN - No evidence of recurrence today - No lymphadenopathy - Recommend regular full body skin exams - Recommend daily broad spectrum sunscreen SPF 30+ to sun-exposed areas, reapply every 2 hours as needed.  - Call if any new or changing lesions are noted between office visits   Purpura - Chronic; persistent and recurrent.  Treatable, but not curable. - Violaceous macules and patches - Benign - Related to trauma, age, sun damage and/or use of blood thinners, chronic use of topical and/or oral steroids - Observe - Can use OTC arnica  containing moisturizer such as Dermend Bruise Formula if desired - Call for worsening or other concerns   SEBORRHEIC KERATOSIS - Stuck-on, waxy, tan-brown papules and/or plaques  - Benign-appearing - Discussed benign etiology and prognosis. 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  - Recommend staying in the shade or wearing long sleeves, sun glasses (UVA+UVB protection) and wide brim hats (4-inch brim around the entire circumference of the hat). - Call for new or changing lesions.  Destruction of lesion - face x 17 (16) Complexity: simple   Destruction method: cryotherapy   Informed consent: discussed and consent obtained   Timeout:  patient name, date of birth, surgical site, and procedure verified Lesion destroyed using liquid nitrogen: Yes   Region frozen until ice ball extended beyond lesion: Yes   Outcome: patient tolerated procedure well with no complications   Post-procedure details: wound care instructions given    INFLAMED SEBORRHEIC KERATOSIS arms x 6 Symptomatic, irritating, patient would like treated.  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 Condition is symptomatic/ bothersome to patient. Not currently at goal.  Rosacea is a chronic progressive skin condition usually affecting the face of adults, causing redness and/or acne bumps. It is treatable but not curable. It sometimes affects the eyes (ocular rosacea) as well. It may respond to topical and/or systemic medication and can flare with stress, sun exposure, alcohol, exercise, topical steroids (including hydrocortisone/cortisone 10) and some foods.  Daily application of broad spectrum spf 30+ sunscreen to face is recommended to reduce flares. Treatment Plan Continue skin medicinals rosacea triple cream     Return in about 6 months (around 12/02/2024) for TBSE, AKs .  I, Fay Kirks, CMA, am acting as scribe for Alm Rhyme, MD .   Documentation: I have reviewed the above documentation for accuracy and completeness, and I agree with the above.  Alm Rhyme, MD

## 2024-06-04 NOTE — Patient Instructions (Addendum)

## 2024-12-02 ENCOUNTER — Ambulatory Visit: Admitting: Dermatology
